# Patient Record
Sex: Female | Born: 1980 | Race: Asian | Hispanic: No | Marital: Married | Smoking: Never smoker
Health system: Southern US, Community
[De-identification: ages and names within clinical notes are randomized; demographics above are authoritative.]

## PROBLEM LIST (undated history)

## (undated) DIAGNOSIS — F319 Bipolar disorder, unspecified: Secondary | ICD-10-CM

## (undated) DIAGNOSIS — F32A Depression, unspecified: Secondary | ICD-10-CM

## (undated) DIAGNOSIS — F329 Major depressive disorder, single episode, unspecified: Secondary | ICD-10-CM

## (undated) DIAGNOSIS — R000 Tachycardia, unspecified: Secondary | ICD-10-CM

## (undated) DIAGNOSIS — F209 Schizophrenia, unspecified: Secondary | ICD-10-CM

## (undated) DIAGNOSIS — E282 Polycystic ovarian syndrome: Secondary | ICD-10-CM

## (undated) DIAGNOSIS — F419 Anxiety disorder, unspecified: Secondary | ICD-10-CM

## (undated) HISTORY — DX: Bipolar disorder, unspecified: F31.9

## (undated) HISTORY — DX: Schizophrenia, unspecified: F20.9

## (undated) HISTORY — DX: Polycystic ovarian syndrome: E28.2

---

## 2009-02-19 ENCOUNTER — Other Ambulatory Visit: Admission: RE | Admit: 2009-02-19 | Discharge: 2009-02-19 | Payer: Self-pay | Admitting: Obstetrics and Gynecology

## 2011-11-09 ENCOUNTER — Emergency Department (INDEPENDENT_AMBULATORY_CARE_PROVIDER_SITE_OTHER)
Admission: EM | Admit: 2011-11-09 | Discharge: 2011-11-09 | Disposition: A | Discharge status: Home / Self Care | Payer: 59 | Source: Home / Self Care | Attending: Family Medicine | Admitting: Family Medicine

## 2011-11-09 ENCOUNTER — Encounter: Payer: Self-pay | Admitting: Emergency Medicine

## 2011-11-09 DIAGNOSIS — F4320 Adjustment disorder, unspecified: Secondary | ICD-10-CM

## 2011-11-09 DIAGNOSIS — F5102 Adjustment insomnia: Secondary | ICD-10-CM

## 2011-11-09 HISTORY — DX: Tachycardia, unspecified: R00.0

## 2011-11-09 MED ORDER — ALPRAZOLAM 0.25 MG PO TABS: 0.2500 mg | ORAL_TABLET | Freq: Three times a day (TID) | ORAL | Status: DC | PRN

## 2011-11-09 MED ORDER — ZOLPIDEM TARTRATE 5 MG PO TABS: 5.0000 mg | ORAL_TABLET | Freq: Every evening | ORAL | Status: DC | PRN

## 2011-11-09 NOTE — ED Provider Notes (Signed)
History     CSN: [id number] Arrival date & time: 11/09/2011  1:28 PM   First MD Initiated Contact with Patient 11/09/11 1453      Chief Complaint  Patient presents with  . Insomnia    (Consider location/radiation/quality/duration/timing/severity/associated sxs/prior treatment) HPI Comments: Jessica Steele presents for evaluation of anxiety and insomnia secondary to stress at work; completed a recent project but is not appreciated, she feels, and argued with boss. She denies any hx of anxiety, panic attacks, or mental illness. She reports increased stress at work. She reports her symptoms as inappropriate crying, feelings of overwhelm, racing heart rate and chest tightness. Patient becomes tearful at the end of the visit, for no apparent reason, stating that she is worried about her symptoms.   Patient is a 30 y.o. female presenting with anxiety. The history is provided by the patient and the spouse.  Anxiety This is a new problem. The problem has not changed since onset.The symptoms are aggravated by nothing. The symptoms are relieved by nothing. She has tried nothing for the symptoms.    Past Medical History  Diagnosis Date  . Fast heart beat     associated with anxiety    History reviewed. No pertinent past surgical history.  History reviewed. No pertinent family history.  History  Substance Use Topics  . Smoking status: Never Smoker   . Smokeless tobacco: Not on file  . Alcohol Use: No    OB History    Grav Para Term Preterm Abortions TAB SAB Ect Mult Living                  Review of Systems  Constitutional: Negative.   HENT: Negative.   Eyes: Negative.   Respiratory: Negative.   Cardiovascular: Negative.   Gastrointestinal: Negative.   Genitourinary: Negative.   Musculoskeletal: Negative.   Skin: Negative.   Neurological: Negative.   Psychiatric/Behavioral: The patient is nervous/anxious.     Allergies  Review of patient's allergies indicates no known  allergies.  Home Medications   Current Outpatient Rx  Name Route Sig Dispense Refill  . ZOLPIDEM TARTRATE 5 MG PO TABS Oral Take 5 mg by mouth at bedtime as needed. sleep       BP 119/85  Pulse 116  Temp(Src) 98.2 F (36.8 C) (Oral)  Resp 18  SpO2 100%  LMP 11/02/2011  Physical Exam  Nursing note and vitals reviewed. Constitutional: She is oriented to person, place, and time. She appears well-developed and well-nourished.  HENT:  Head: Normocephalic and atraumatic.  Right Ear: Tympanic membrane normal.  Left Ear: Tympanic membrane normal.  Mouth/Throat: Uvula is midline, oropharynx is clear and moist and mucous membranes are normal.  Eyes: EOM are normal.  Neck: Normal range of motion.  Cardiovascular: Normal rate and regular rhythm.   Pulmonary/Chest: Effort normal and breath sounds normal. She has no wheezes. She has no rhonchi. She has no rales.  Musculoskeletal: Normal range of motion.  Neurological: She is alert and oriented to person, place, and time.  Skin: Skin is warm and dry.  Psychiatric: Her behavior is normal.    ED Course  Procedures (including critical care time)  Labs Reviewed - No data to display No results found.   1. Adjustment insomnia   2. Adjustment disorder       MDM          Richardo Priest, MD 11/27/11 [phone]

## 2011-11-09 NOTE — ED Notes (Signed)
"  tension' anxiety"more difficulty than usual sleeping.

## 2011-11-10 ENCOUNTER — Emergency Department (HOSPITAL_COMMUNITY)
Admission: EM | Admit: 2011-11-10 | Discharge: 2011-11-10 | Disposition: A | Discharge status: Other Acute Inpatient Hospital | Payer: 59 | Attending: Emergency Medicine | Admitting: Emergency Medicine

## 2011-11-10 ENCOUNTER — Emergency Department (HOSPITAL_COMMUNITY): Payer: 59

## 2011-11-10 ENCOUNTER — Encounter (HOSPITAL_COMMUNITY): Payer: Self-pay | Admitting: *Deleted

## 2011-11-10 DIAGNOSIS — F29 Unspecified psychosis not due to a substance or known physiological condition: Secondary | ICD-10-CM | POA: Insufficient documentation

## 2011-11-10 DIAGNOSIS — F411 Generalized anxiety disorder: Secondary | ICD-10-CM | POA: Insufficient documentation

## 2011-11-10 DIAGNOSIS — F419 Anxiety disorder, unspecified: Secondary | ICD-10-CM

## 2011-11-10 LAB — URINALYSIS, ROUTINE W REFLEX MICROSCOPIC
Glucose, UA: NEGATIVE mg/dL
Leukocytes, UA: NEGATIVE
Nitrite: NEGATIVE
Specific Gravity, Urine: 1.01 (ref 1.005–1.030)
pH: 7.5 (ref 5.0–8.0)

## 2011-11-10 LAB — BASIC METABOLIC PANEL
CO2: 24 mEq/L (ref 19–32)
Chloride: 102 mEq/L (ref 96–112)
GFR calc Af Amer: >90 mL/min (ref >90)
Potassium: 3.6 mEq/L (ref 3.5–5.1)
Sodium: 136 mEq/L (ref 135–145)

## 2011-11-10 LAB — DIFFERENTIAL
Eosinophils Relative: 1 % (ref 0–5)
Lymphocytes Relative: 25 % (ref 12–46)
Lymphs Abs: 4.2 10*3/uL — ABNORMAL HIGH (ref 0.7–4.0)
Neutro Abs: 11.4 10*3/uL — ABNORMAL HIGH (ref 1.7–7.7)
Neutrophils Relative %: 68 % (ref 43–77)

## 2011-11-10 LAB — RAPID URINE DRUG SCREEN, HOSP PERFORMED
Amphetamines: NOT DETECTED
Cocaine: NOT DETECTED
Opiates: NOT DETECTED
Tetrahydrocannabinol: NOT DETECTED

## 2011-11-10 LAB — CBC
MCV: 86.6 fL (ref 78.0–100.0)
Platelets: 392 10*3/uL (ref 150–400)
RBC: 4.91 MIL/uL (ref 3.87–5.11)
WBC: 16.7 10*3/uL — ABNORMAL HIGH (ref 4.0–10.5)

## 2011-11-10 MED ORDER — ALUM & MAG HYDROXIDE-SIMETH 200-200-20 MG/5ML PO SUSP: 30.0000 mL | ORAL | Status: DC | PRN

## 2011-11-10 MED ORDER — NICOTINE 21 MG/24HR TD PT24: 21.0000 mg | MEDICATED_PATCH | Freq: Every day | TRANSDERMAL | Status: DC

## 2011-11-10 MED ORDER — ONDANSETRON HCL 4 MG PO TABS: 4.0000 mg | ORAL_TABLET | Freq: Three times a day (TID) | ORAL | Status: DC | PRN

## 2011-11-10 MED ORDER — ZOLPIDEM TARTRATE 5 MG PO TABS: 5.0000 mg | ORAL_TABLET | Freq: Every evening | ORAL | Status: DC | PRN

## 2011-11-10 MED ORDER — ACETAMINOPHEN 325 MG PO TABS: 650.0000 mg | ORAL_TABLET | ORAL | Status: DC | PRN

## 2011-11-10 MED ORDER — QUETIAPINE FUMARATE 50 MG PO TABS
50.0000 mg | ORAL_TABLET | Freq: Every day | ORAL | Status: DC
  Administered 2011-11-10: 50 mg via ORAL
  Filled 2011-11-10 (×2): qty 1

## 2011-11-10 MED ORDER — LORAZEPAM 1 MG PO TABS: 1.0000 mg | ORAL_TABLET | Freq: Three times a day (TID) | ORAL | Status: DC | PRN

## 2011-11-10 MED ORDER — IBUPROFEN 600 MG PO TABS: 600.0000 mg | ORAL_TABLET | Freq: Three times a day (TID) | ORAL | Status: DC | PRN

## 2011-11-10 MED ORDER — ZIPRASIDONE MESYLATE 20 MG IM SOLR
20.0000 mg | Freq: Once | INTRAMUSCULAR | Status: AC
  Administered 2011-11-10: 20 mg via INTRAMUSCULAR
  Filled 2011-11-10 (×2): qty 20

## 2011-11-10 NOTE — ED Notes (Signed)
PA-C states "pt verbalized wanting to hurt herself"; acuity changed.

## 2011-11-10 NOTE — ED Notes (Signed)
Pt became agitated when husband was present.  Husband was asked to leave until patient calmed down.  Pt then removed all of her clothes and got into the cabinet in her room.  Advised her to dress and get onto the stretcher.  Pt refused to listen.  Dr Rogers Blocker advised and he ordered geodon.  Pt refused to take med.  Had to be held to administer.  Pt was very combative and attempted to assault staff while getting the injection.

## 2011-11-10 NOTE — ED Notes (Signed)
Spouse states "I didn't want to give her the xanax she got yesterday, I just wanted her to sleep"

## 2011-11-10 NOTE — ED Provider Notes (Signed)
5:05 PM patient is alert ambulatory mildly agitated and argumentative with husband. Plan transfer to [facility]. Accepting physician Dr. Gaspar Skeeters, MD 11/10/11 [phone]

## 2011-11-10 NOTE — ED Notes (Addendum)
Currently no 400 hall beds at [facility]. TC to [facility] & spoke with Jill Alexanders who stated they do have some openings. Faxed info over for their review. TC from [place] stating that pt has been accepted by Dr. Les Pou to [facility].

## 2011-11-10 NOTE — ED Notes (Signed)
Pt states "I have been having a lot of anxiety, am pressured @ work, am not sleeping"

## 2011-11-10 NOTE — ED Notes (Signed)
Pt placed in paper scrubs, wanded by Security, possessions taken to car by family member

## 2011-11-10 NOTE — ED Notes (Signed)
Patient family/friend took pt belongings home.

## 2011-11-10 NOTE — BH Assessment (Signed)
Assessment Note   Jessica Steele is an 30 y.o. female. Brought in by husband. Pt and husband reported pt not sleeping for the past 3 days, flight of ideas, tangential thought/speech and manic behaviors. During assessment, pt states that "I am in control of my emotions and know what I am doing" numerous times and stated she had been reviewing "all of the traumatic things that have occurred to me in my life since I was born". Pt did report having some stressors at work, feeling as if she had caused some pressure on a colleague which she could not forget about. Husband reported to psych staff that pt has no prior MH history. Prior to assessment, pt had removed all of her clothing and was naked in the cubby of the room. Upon entering, pt got dressed and spoke with ACT.  Axis I: Psychotic Disorder NOS Axis II: Deferred Axis III:  Past Medical History  Diagnosis Date  . Fast heart beat     associated with anxiety   Axis IV: occupational problems Axis V: 11-20 some danger of hurting self or others possible OR occasionally fails to maintain minimal personal hygiene OR gross impairment in communication  Past Medical History:  Past Medical History  Diagnosis Date  . Fast heart beat     associated with anxiety    History reviewed. No pertinent past surgical history.  Family History: No family history on file.  Social History:  reports that she has never smoked. She does not have any smokeless tobacco history on file. She reports that she does not drink alcohol or use illicit drugs.  Allergies: No Known Allergies  Home Medications:  Medications Prior to Admission  Medication Dose Route Frequency Provider Last Rate Last Dose  . acetaminophen (TYLENOL) tablet 650 mg  650 mg Oral Q4H PRN Jethro Bastos, NP      . LORazepam (ATIVAN) tablet 1 mg  1 mg Oral Q8H PRN Jethro Bastos, NP      . ondansetron [facility]) tablet 4 mg  4 mg Oral Q8H PRN Jethro Bastos, NP      . QUEtiapine (SEROQUEL) tablet  50 mg  50 mg Oral QHS Shamsher Alvie Heidelberg, MD      . ziprasidone (GEODON) injection 20 mg  20 mg Intramuscular Once Katheren Puller, MD   20 mg at 11/10/11 1144   Medications Prior to Admission  Medication Sig Dispense Refill  . zolpidem (AMBIEN) 5 MG tablet Take 5 mg by mouth at bedtime as needed. sleep         OB/GYN Status:  Patient's last menstrual period was 11/02/2011.  General Assessment Data Assessment Number: 1  Living Arrangements: Spouse/significant other Can pt return to current living arrangement?: Yes Admission Status: Involuntary Is patient capable of signing voluntary admission?: No Transfer from: Home Referral Source: Self/Family/Friend  Risk to self Suicidal Ideation: No Suicidal Intent: No Is patient at risk for suicide?: No Suicidal Plan?: No Access to Means: No What has been your use of drugs/alcohol within the last 12 months?: Use of Xanax past few days, accoring to spouse Other Self Harm Risks: n/a Triggers for Past Attempts: None known Intentional Self Injurious Behavior: None Factors that decrease suicide risk: Sense of responsibility to family Family Suicide History: Unable to assess Recent stressful life event(s): Other (Comment) (job stress - "Putting a little pressure on someone") Persecutory voices/beliefs?: No Depression: Yes Depression Symptoms: Insomnia;Tearfulness;Feeling worthless/self pity Substance abuse history and/or treatment for substance abuse?: No Suicide  prevention information given to non-admitted patients: Not applicable  Risk to Others Homicidal Ideation: No Thoughts of Harm to Others: No Current Homicidal Intent: No Current Homicidal Plan: No Access to Homicidal Means: No Identified Victim: n/a History of harm to others?: No Assessment of Violence: None Noted Violent Behavior Description: n/a Does patient have access to weapons?: No Criminal Charges Pending?: No Does patient have a court date: No  Mental Status  Report Appear/Hygiene: Disheveled Eye Contact: Fair Motor Activity: Restlessness Speech: Tangential Level of Consciousness: Restless;Combative (combative at times, refusing medication) Mood: Labile Affect: Labile Anxiety Level: Minimal Thought Processes: Tangential Judgement: Impaired Orientation: Person;Place;Time Obsessive Compulsive Thoughts/Behaviors: None  Cognitive Functioning Concentration: Decreased Memory: Recent Impaired;Remote Impaired IQ: Average Insight: Poor Impulse Control: Poor Appetite: Fair Sleep: Decreased (Not slept in 3 days) Total Hours of Sleep: 0  Vegetative Symptoms: None  Prior Inpatient/Outpatient Therapy Prior Therapy:  (No Prior MH IPT or OPT hx)  ADL Screening (condition at time of admission) Patient's cognitive ability adequate to safely complete daily activities?: Yes Patient able to express need for assistance with ADLs?: Yes Independently performs ADLs?: Yes Weakness of Legs: None Weakness of Arms/Hands: None  Home Assistive Devices/Equipment Home Assistive Devices/Equipment: None    Abuse/Neglect Assessment (Assessment to be complete while patient is alone) Physical Abuse: Denies Verbal Abuse: Denies Sexual Abuse: Denies Exploitation of patient/patient's resources: Denies Self-Neglect: Denies     Merchant navy officer (For Healthcare) Advance Directive: Patient does not have advance directive;Patient would not like information Pre-existing out of facility DNR order (yellow form or pink MOST form): No    Additional Information 1:1 In Past 12 Months?: No CIRT Risk: Yes Elopement Risk: No Does patient have medical clearance?: Yes     Disposition:  Disposition Disposition of Patient: Referred to [facility] for Review)  On Site Evaluation by:   Reviewed with Physician:     Romeo Apple 11/10/2011 12:11 PM

## 2011-11-10 NOTE — ED Provider Notes (Signed)
History     CSN: [id number] Arrival date & time: 11/10/2011  7:54 AM   First MD Initiated Contact with Patient 11/10/11 [phone]      Chief Complaint  Patient presents with  . Anxiety    (Consider location/radiation/quality/duration/timing/severity/associated sxs/prior treatment) Patient is a 30 y.o. female presenting with anxiety. The history is provided by the patient. No language interpreter was used.  Anxiety Pertinent negatives include no abdominal pain, anorexia, chills, congestion, coughing, diaphoresis, fatigue, fever, headaches, joint swelling, neck pain, numbness, rash, sore throat, swollen glands, vertigo, visual change or weakness. The treatment provided no relief.   Increasing irritability and depression especially for the last 6 months.  Flight of ideas as we speak.  States that she has been thinking about hurting herself but has no plan.  Husband states that she has been depressed but only for days at a time. This time it has lasted 3 days and she is not sleeping.  Patient states that she tries to please everyone to be their friend.  Then goes on to states that the water comes up over her head and then she is not their friend anymore. Past Medical History  Diagnosis Date  . Fast heart beat     associated with anxiety    History reviewed. No pertinent past surgical history.  No family history on file.  History  Substance Use Topics  . Smoking status: Never Smoker   . Smokeless tobacco: Not on file  . Alcohol Use: No    OB History    Grav Para Term Preterm Abortions TAB SAB Ect Mult Living                  Review of Systems  Constitutional: Negative for fever, chills, diaphoresis and fatigue.  HENT: Negative for congestion, sore throat and neck pain.   Respiratory: Negative for cough.   Gastrointestinal: Negative for abdominal pain and anorexia.  Musculoskeletal: Negative for joint swelling.  Skin: Negative for rash.  Neurological: Negative for vertigo,  weakness, numbness and headaches.  All other systems reviewed and are negative.    Allergies  Review of patient's allergies indicates no known allergies.  Home Medications   Current Outpatient Rx  Name Route Sig Dispense Refill  . ZOLPIDEM TARTRATE 5 MG PO TABS Oral Take 5 mg by mouth at bedtime as needed. sleep       BP 135/89  Pulse 101  Temp(Src) 98.8 F (37.1 C) (Oral)  Resp 17  Wt 128 lb (58.06 kg)  SpO2 99%  LMP 11/02/2011  Physical Exam  Nursing note and vitals reviewed. Constitutional: She is oriented to person, place, and time. She appears well-developed and well-nourished. She appears distressed.  HENT:  Head: Normocephalic.  Cardiovascular: Normal heart sounds and normal pulses.  Tachycardia present.  Exam reveals no gallop.   No murmur heard. Pulmonary/Chest: Effort normal and breath sounds normal. No respiratory distress.  Abdominal: Soft.  Musculoskeletal: Normal range of motion.  Neurological: She is alert and oriented to person, place, and time.  Skin: Skin is warm.  Psychiatric:       Abnormal thought content.  Pressured speech.  ? Manic.  No sleep in 3 days.    ED Course  Procedures (including critical care time)  Labs Reviewed - No data to display No results found.   No diagnosis found.    MDM  Here for increasing depression and suicidal ideations.  With her husband who states she has not slept in 3 days.  Pressured speech and manic behavior in the ER.  Suspect bipolar disorder.  For Behavior health to evaluate.          Jethro Bastos, NP 11/10/11 1626

## 2011-11-10 NOTE — Consult Note (Signed)
Patient Identification:  Jessica Steele Date of Evaluation:  11/10/2011   History of Present Illness:    30 year old female from [place] who is currently very disorganized patient's husband told me that she has not slept for the last 3 days and whenever she becomes angry she goes through this kind of episodes. But patient at this time is unable to make a logical conversation she is laughing hysterically during interview. Her husband told me that the she talks about things happened in the past before to marriage which don't make any sense to him.  He was also very the upset and was crying.  He denied any past psych history or drug abuse history.  Mental status examination-patient is ready disorganized laughing hysterically responding to inner stimuli alert awake oriented to person but not to place and time attention and concentration poor abstract poor insight and judgment poor  Past Medical History:     Past Medical History  Diagnosis Date  . Fast heart beat     associated with anxiety      History reviewed. No pertinent past surgical history.  Allergies: No Known Allergies  Current Medications:  Prior to Admission medications   Medication Sig Start Date End Date Taking? Authorizing Provider  zolpidem (AMBIEN) 5 MG tablet Take 5 mg by mouth at bedtime as needed. sleep  11/09/11 11/08/12  Richardo Priest, MD    Social History:    reports that she has never smoked. She does not have any smokeless tobacco history on file. She reports that she does not drink alcohol or use illicit drugs.   Family History:    No family history on file.   DIAGNOSIS:   AXIS I  psychosis NOS   AXIS II  Deffered  AXIS III See medical notes.  AXIS IV  nonspecified   AXIS V 31-40 impairment in reality testing     Recommendations: #1 Geodon 20 mg given stat #2 I started the patient on Seroquel 50 mg 1 dose now and 50 mg at bedtime #3 I will also get a CAT scan done as it is the first psychotic  break.    Eulogio Ditch, MD

## 2011-11-11 NOTE — ED Provider Notes (Signed)
Medical screening examination/treatment/procedure(s) were conducted as a shared visit with non-physician practitioner(s) and myself.  I personally evaluated the patient during the encounter.  Complains of anxiety, depression, lack of sleep. Behavioral health consult  Donnetta Hutching, MD 11/11/11 [phone]

## 2012-04-06 IMAGING — CT CT HEAD W/O CM
2 series · 17 of 30 positions shown, 20 images · non-contrast
Comparison: None

CLINICAL DATA: First psychotic break, insomnia, flight of ideas,
confusion, altered and manic behavior

CT HEAD WITHOUT CONTRAST
TECHNIQUE: Contiguous axial images were obtained from the base of
the skull through the vertex without contrast.

[Series 2: head w/o · axial · non-contrast · 0.43mm/px · z∈[-135,-15]mm · 9 of 30 slices shown, 12 images]
[im 3/30  brain]
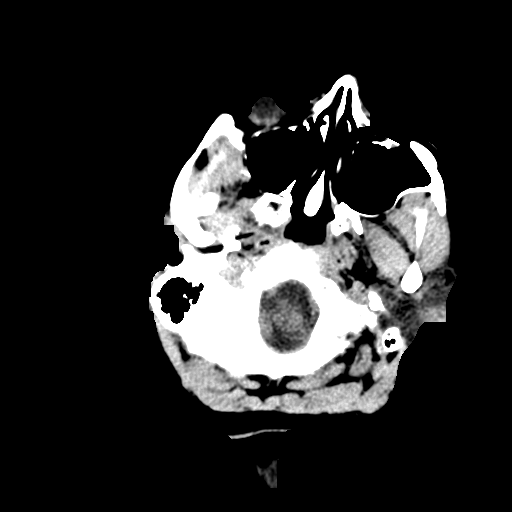
[im 3/30  bone]
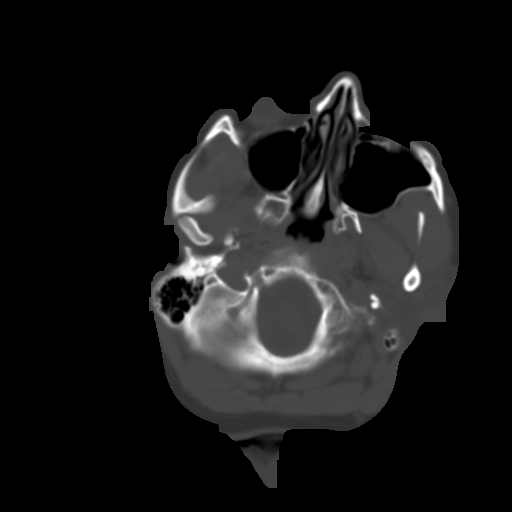
[im 6/30  brain]
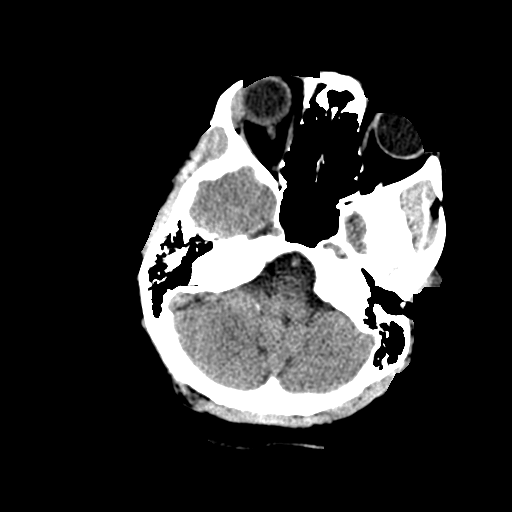
[im 9/30  brain]
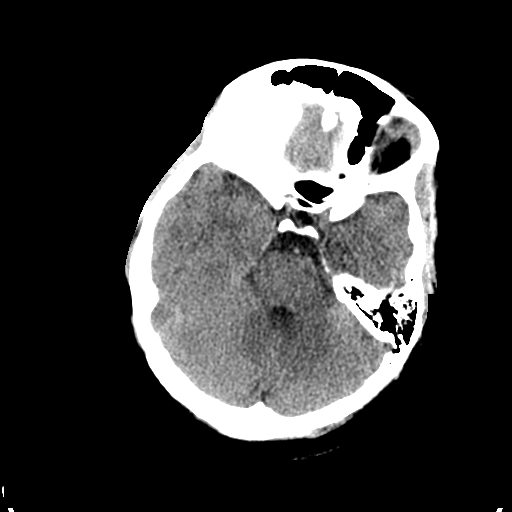
[im 12/30  brain]
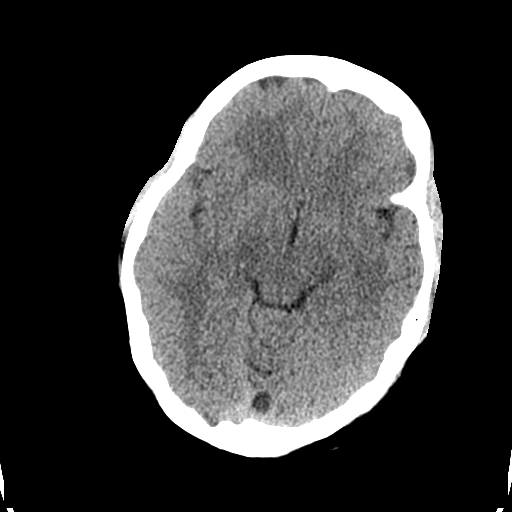
[im 15/30  brain]
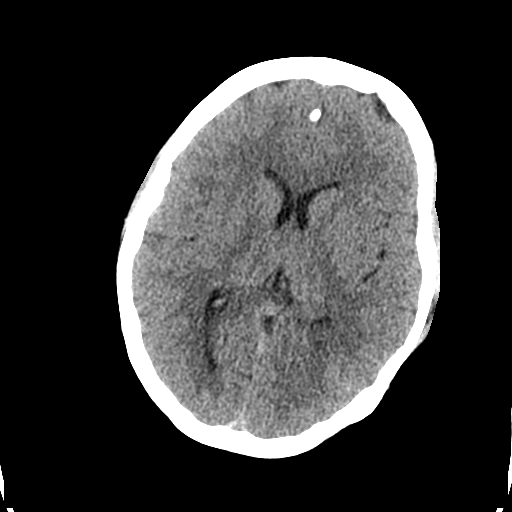
[im 15/30  bone]
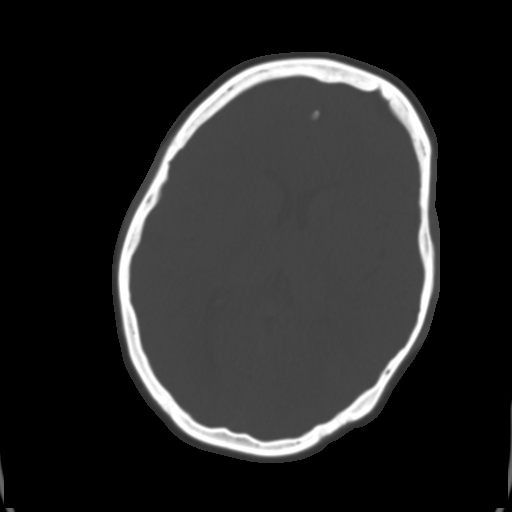
[im 18/30  brain]
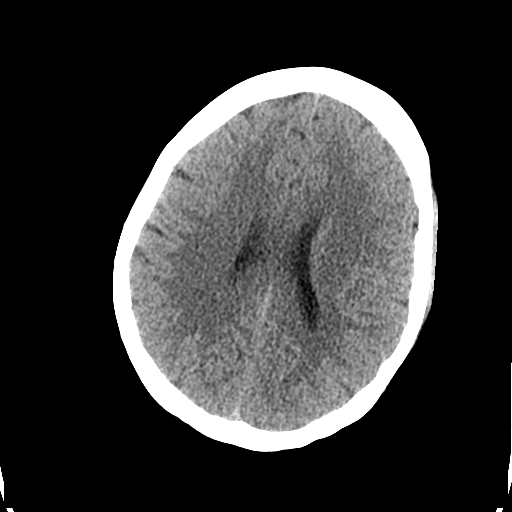
[im 21/30  brain]
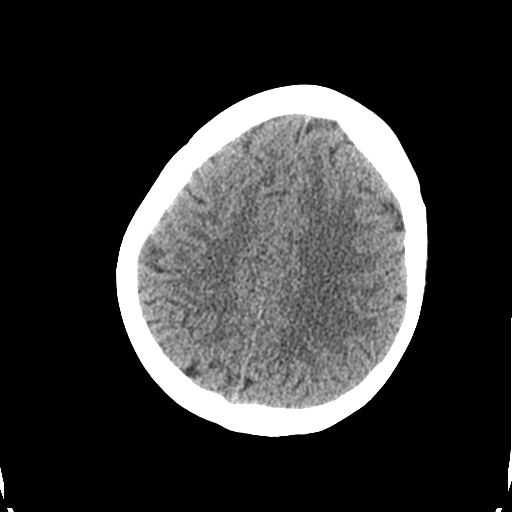
[im 24/30  brain]
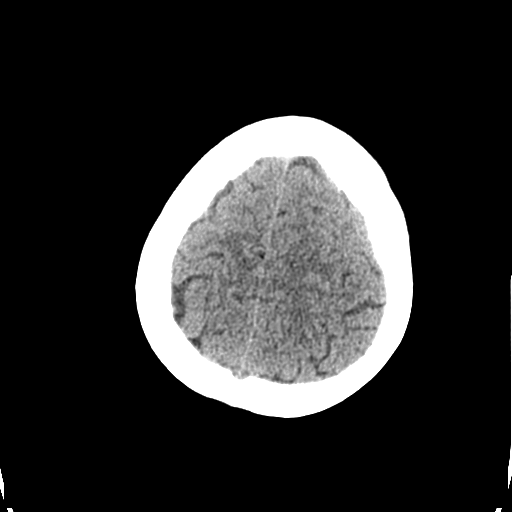
[im 27/30  brain]
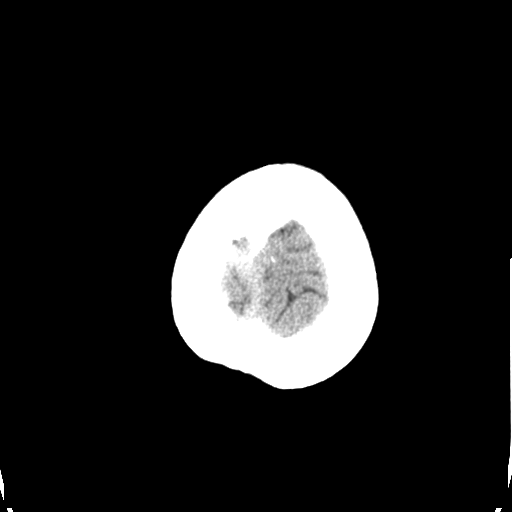
[im 27/30  bone]
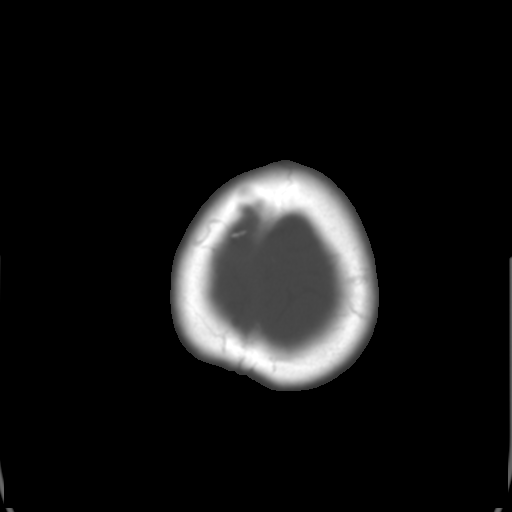

[Series 3: bone windows · axial · 0.43mm/px · z∈[-130,-19]mm · 8 of 49 slices shown]
[im 6/49  bone]
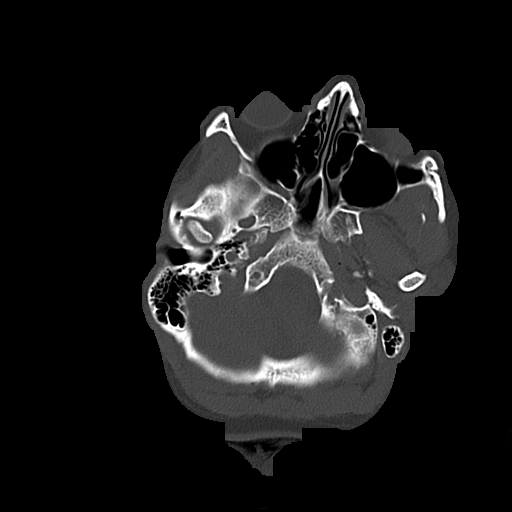
[im 11/49  bone]
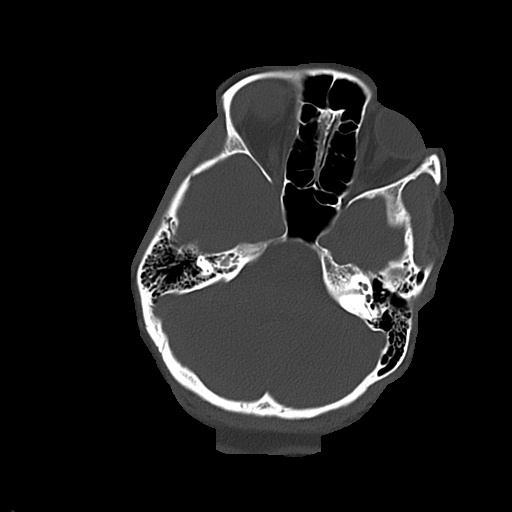
[im 17/49  bone]
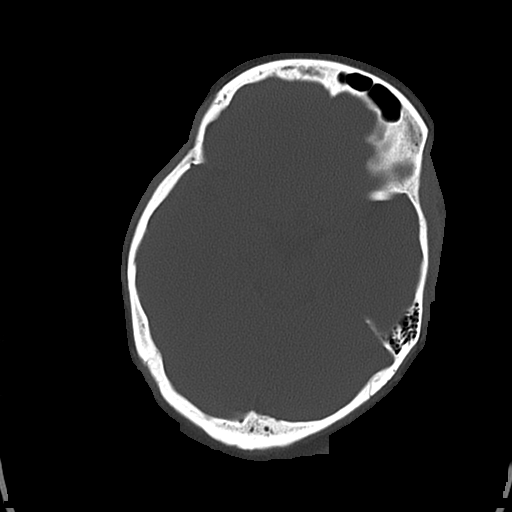
[im 22/49  bone]
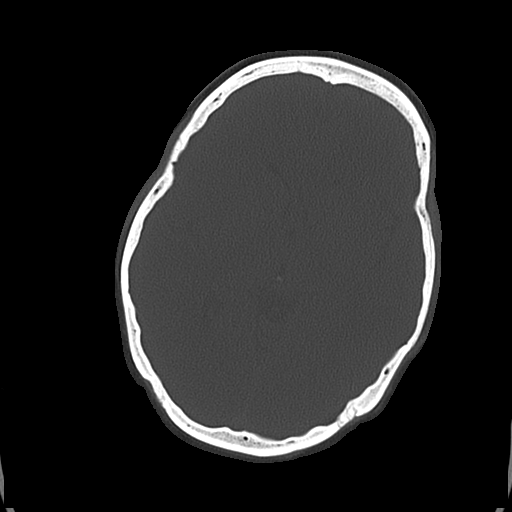
[im 27/49  bone]
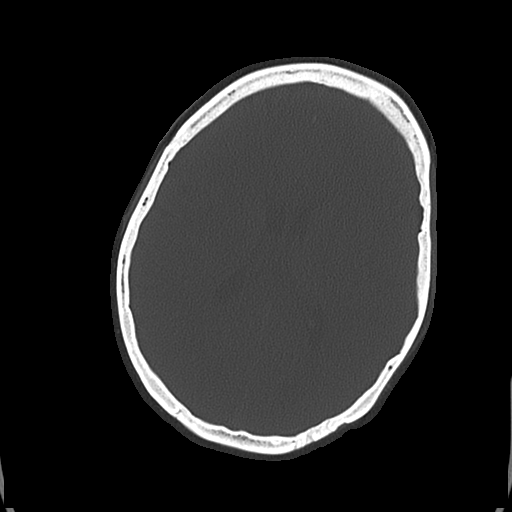
[im 33/49  bone]
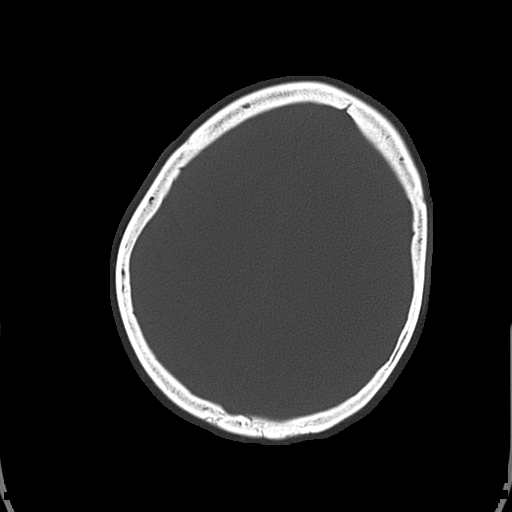
[im 38/49  bone]
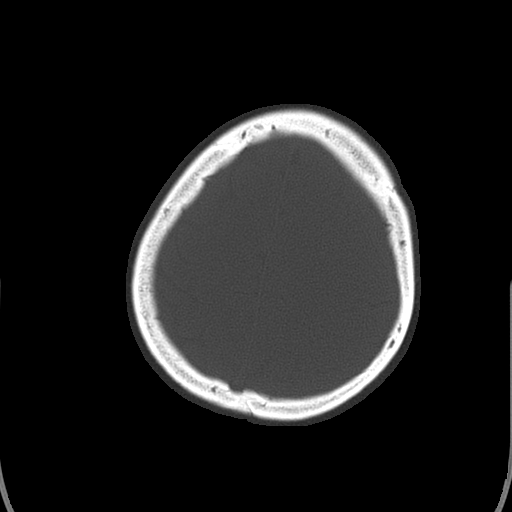
[im 43/49  bone]
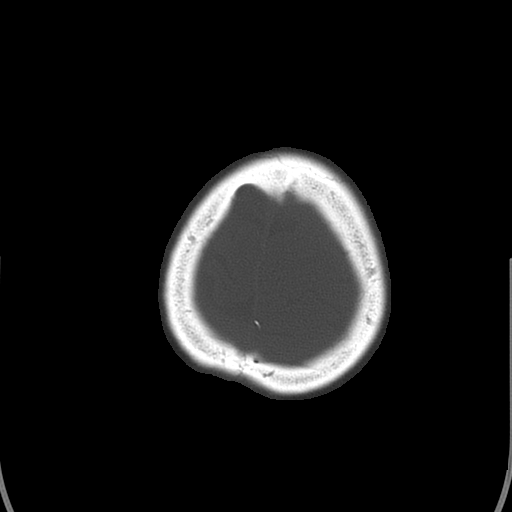

[17 of 30 positions shown; findings below may reference images not displayed]

FINDINGS: Normal ventricular morphology.
No midline shift or mass effect.
Normal appearance of brain parenchyma.
No intracranial hemorrhage, mass lesion, or acute infarction.
Visualized paranasal sinuses and mastoid air cells clear.
Bones unremarkable.
IMPRESSION: Normal exam.

## 2015-09-02 ENCOUNTER — Encounter (INDEPENDENT_AMBULATORY_CARE_PROVIDER_SITE_OTHER): Payer: Self-pay

## 2015-09-02 ENCOUNTER — Telehealth (HOSPITAL_COMMUNITY): Payer: Self-pay

## 2015-09-02 ENCOUNTER — Encounter (HOSPITAL_COMMUNITY): Payer: Self-pay | Admitting: Psychiatry

## 2015-09-02 ENCOUNTER — Ambulatory Visit (INDEPENDENT_AMBULATORY_CARE_PROVIDER_SITE_OTHER): Payer: 59 | Admitting: Psychiatry

## 2015-09-02 VITALS — BP 130/72 | HR 96 | Ht 60.5 in | Wt 169.2 lb

## 2015-09-02 DIAGNOSIS — Z79899 Other long term (current) drug therapy: Secondary | ICD-10-CM | POA: Diagnosis not present

## 2015-09-02 DIAGNOSIS — F23 Brief psychotic disorder: Secondary | ICD-10-CM | POA: Diagnosis not present

## 2015-09-02 MED ORDER — RISPERIDONE 2 MG PO TABS
2.0000 mg | ORAL_TABLET | Freq: Every day | ORAL | Status: DC
Start: 2015-09-02 — End: 2015-11-04

## 2015-09-02 NOTE — Progress Notes (Addendum)
Psychiatric Initial Adult Assessment   Patient Identification: Jessica Steele MRN:  [id number] Date of Evaluation:  09/02/2015 Referral Source: husband Chief Complaint:  establish care Visit Diagnosis:    ICD-9-CM ICD-10-CM   1. 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 Encounter for long-term (current) use of medications V58.69 Z79.899 EKG   Diagnosis:   Patient Active Problem List   Diagnosis Date Noted  . Brief psychotic disorder [F23] 09/02/2015   History of Present Illness:  Pt here to establish care today. States she is running out of meds as she recently moved here from [place]. In 2012 pt had a episode of possible mania vs psychosis that lead to an inpt admission for 10 days to [facility]. Since then pt denies manic and hypomanic symptoms including periods of decreased need for sleep, increased energy, mood lability, impulsivity, FOI, and excessive spending. Denies any further episodes of hallucinations and paranoia.   After the episode for about 1-1.5 yrs she was depressed and had crying spells. Pt was withdrawn, had low motivation, anhedonia, and isolation. She went to counselling and it helped. She has been on Effexor since early 2013 and is unsure if it is helping. Today denies depression, low motivation, anhedonia, crying spells, isolation, SI and HI. Pt is looking for work next month.   Sleeping about 11-12 hrs/night. Appetite, energy and concentration are good.   Pt is taking Effexor and Risperdal daily as prescribed and denies SE. Notes at doses of 3+mg pt has increase in Prolactin and produces breast milk and lack of periods. Reports her last period was in Jan 2016 when Risperdal dose was decreased from  to . Pt has a long hx of irregular periods due to PCOS.  Pt went to [facility] in [place] in April 2016 and reports all labs were normal.   Husband joined at the end of appt with pt's permission- states end of 2012 pt was under a lot of stress  from family and work. Within one week pt was not sleeping at all for 3 days and began acting strange. They went to the ED and was given Ambien. Pt took one dose and and she went to the ED again and was IVC to [facility]. She was confused, having racing thoughts, AVH and restlessness. After d/c pt was better but depressed. At follow up her Depakote and Risperdal was decreased and pt experienced some AH. Dose of Risperdal was increased and symptoms resolved. They have been back and forth to [place] since 2014 for [employer] reasons. They have now moved back to the [place]. For the last 6 months pt has been doing well. He is a little concerned that she has mood swings that are stress induced. Pt will get angry for about 4 hrs and then is back to normal.    Elements:  Severity:  mild. Timing:  ongoing. Duration:  4  yrs. Context:  quality of life. Associated Signs/Symptoms: Depression Symptoms:  denies. see above. (Hypo) Manic Symptoms:  denies.see above Anxiety Symptoms:  denies Psychotic Symptoms:  denies PTSD Symptoms: Negative  Past Medical History:  Past Medical History  Diagnosis Date  . Fast heart beat     associated with anxiety  . Bipolar disorder   . Schizophrenia     Past Surgical History  Procedure Laterality Date  . Cesarean section     Past Psych Hx: Dx: Bipolar disorder vs Schizophrenia began in 2012 due to stress and sleep issues. Symptoms included AH, hypersexual,  talkative, confusion. Pt doesn't have much memory of the episode and states it lasted for 6 days. Prior had no past psych hx. Meds: Klonopin, Risperdal 3+mg cause rise in Prolactin and lactation, Depakote Previous psychiatrist/therapist: since admission was treated by Dr. Zollie Beckers and a psychiatrist in [place] in 2105.  Hospitalizations: Pt was in [facility] for 10 days in Dec 2012. SIB: denies Suicide attempts: denies Hx of violent behavior towards others: denies. Denies current access to guns Hx of abuse:  denies   Family History:  Family History  Problem Relation Age of Onset  . Alcohol abuse Neg Hx   . Anxiety disorder Neg Hx   . Bipolar disorder Neg Hx   . Drug abuse Neg Hx   . Depression Neg Hx   . Schizophrenia Neg Hx   . Suicidality Neg Hx    Social History:   Social History   Social History  . Marital Status: Married    Spouse Name: N/A  . Number of Children: N/A  . Years of Education: N/A   Social History Main Topics  . Smoking status: Never Smoker   . Smokeless tobacco: None  . Alcohol Use: No  . Drug Use: No  . Sexual Activity: Not Asked   Other Topics Concern  . None   Social History Narrative   Additional Social History: Married and living with husband and 5yo son.  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 Grew up in [place]. Pt has a BS in engineering and denies problems in school.   Musculoskeletal: Strength & Muscle Tone: within normal limits Gait & Station: normal Patient leans: N/A  Psychiatric Specialty Exam: HPI  Review of Systems  Constitutional: Negative for fever, chills and weight loss.  HENT: Negative for congestion, ear pain, nosebleeds and sore throat.   Eyes: Negative for blurred vision, double vision and redness.  Respiratory: Negative for cough, shortness of breath and wheezing.   Cardiovascular: Negative for chest pain, palpitations and leg swelling.  Gastrointestinal: Negative for heartburn, nausea, vomiting, abdominal pain and diarrhea.  Musculoskeletal: Negative for back pain, joint pain and neck pain.  Skin: Negative for itching and rash.  Neurological: Negative for dizziness, tingling, tremors, seizures, loss of consciousness and headaches.    Blood pressure 130/72, pulse 96, height 5' 0.5" (1.537 m), weight 169 lb 3.2 oz (76.749 kg).Body mass index is 32.49 kg/(m^2).  General Appearance: Fairly Groomed  Eye Contact:  Good  Speech:  Clear and Coherent and Normal Rate  Volume:  Normal  Mood:  Euthymic   Affect:  Full Range  Thought Process:  Goal Directed, Linear and Logical  Orientation:  Full (Time, Place, and Person)  Thought Content:  Negative  Suicidal Thoughts:  No  Homicidal Thoughts:  No  Memory:  Immediate;   Good Recent;   Good Remote;   Good  Judgement:  Good  Insight:  Good  Psychomotor Activity:  Normal  Concentration:  Good  Recall:  Good  Fund of Knowledge:Good  Language: Good  Akathisia:  No  Handed:  Right  AIMS (if indicated):  AIMS:  Facial and Oral Movements  Muscles of Facial Expression: None, normal  Lips and Perioral Area: None, normal  Jaw: None, normal  Tongue: None, normal Extremity Movements: Upper (arms, wrists, hands, fingers): None, normal  Lower (legs, knees, ankles, toes): None, normal,  Trunk Movements:  Neck, shoulders, hips: None, normal,  Overall Severity : Severity of abnormal movements (highest score from questions  above): None, normal  Incapacitation due to abnormal movements: None, normal  Patient's awareness of abnormal movements (rate only patient's report): No Awareness, Dental Status  Current problems with teeth and/or dentures?: No  Does patient usually wear dentures?: No     Assets:  Communication Skills Desire for Improvement Financial Resources/Insurance Housing Intimacy Leisure Time Physical Health Resilience Social Support Talents/Skills Transportation Vocational/Educational  ADL's:  Intact  Cognition: WNL  Sleep:  good   Is the patient at risk to self?  No. Has the patient been a risk to self in the past 6 months?  No. Has the patient been a risk to self within the distant past?  No. Is the patient a risk to others?  No. Has the patient been a risk to others in the past 6 months?  No. Has the patient been a risk to others within the distant past?  No.  Allergies:  No Known Allergies Current Medications: Current Outpatient Prescriptions  Medication Sig Dispense Refill  . risperiDONE (RISPERDAL) 2 MG  tablet Take 2 mg by mouth at bedtime.    Marland Kitchen venlafaxine XR (EFFEXOR-XR) 75 MG 24 hr capsule Take 75 mg by mouth daily with breakfast.     No current facility-administered medications for this visit.    Previous Psychotropic Medications: yes see above   Substance Abuse History in the last 12 months:  No.  Consequences of Substance Abuse: Negative  Medical Decision Making:  Established Problem, Stable/Improving (1), Review of Psycho-Social Stressors (1), Review or order clinical lab tests (1), Review and summation of old records (2), Review of Medication Regimen & Side Effects (2) and Review of New Medication or Change in Dosage (2)  Treatment Plan Summary: Medication management and Plan see below  Assessment: Bipolar vs Schizophrenia  Meds: Risperdal  po qHS for mood stabalization D/c Effexor as pt reports depression symptoms have been in remission since 2014. Pt will monitor for signs of depression. Discussed withdrawal symtpoms. Will restart if mood declines.  Medication management with supportive therapy. Risks/benefits and SE of the medication discussed. Pt verbalized understanding and verbal consent obtained for treatment.  Affirm with the patient that the medications are taken as ordered. Patient expressed understanding of how their medications were to be used.   Reviewed records from [facility] and previous psychiatrist- consistent with pt's reported hx.   Labs- order EKG Pt will bring in recent labs for review. Pt reports symptoms of amenorrhea, hair loss and weight gain that could be SE from Risperdal or due to PCOS. Unclear at this time so will continue to monitor.   Therapy: brief supportive therapy provided. Discussed psychosocial stressors in detail.    Pt denies SI and is at an acute low risk for suicide.Patient told to call clinic if any problems occur. Patient advised to go to ER if they should develop SI/HI, side effects, or if symptoms worsen. Has crisis numbers  to call if needed. Pt verbalized understanding.  F/up in 2 months or sooner if needed   AGARWAL, SALINA 9/27/201611:00 AM

## 2015-09-02 NOTE — Patient Instructions (Signed)
1.Call [phone] to schedule the EKG 2. Drop off copy of recent labs to Dr. Michae Kava

## 2015-09-12 ENCOUNTER — Ambulatory Visit (HOSPITAL_COMMUNITY)
Admission: RE | Admit: 2015-09-12 | Discharge: 2015-09-12 | Disposition: A | Discharge status: Home / Self Care | Payer: 59 | Source: Ambulatory Visit | Attending: Psychiatry | Admitting: Psychiatry

## 2015-09-12 ENCOUNTER — Other Ambulatory Visit (HOSPITAL_COMMUNITY): Payer: Self-pay

## 2015-09-12 DIAGNOSIS — R000 Tachycardia, unspecified: Secondary | ICD-10-CM | POA: Insufficient documentation

## 2015-09-12 DIAGNOSIS — Z79899 Other long term (current) drug therapy: Secondary | ICD-10-CM | POA: Diagnosis not present

## 2015-09-12 DIAGNOSIS — Z5181 Encounter for therapeutic drug level monitoring: Secondary | ICD-10-CM | POA: Diagnosis not present

## 2015-11-04 ENCOUNTER — Encounter (HOSPITAL_COMMUNITY): Payer: Self-pay | Admitting: Psychiatry

## 2015-11-04 ENCOUNTER — Ambulatory Visit (INDEPENDENT_AMBULATORY_CARE_PROVIDER_SITE_OTHER): Payer: 59 | Admitting: Psychiatry

## 2015-11-04 VITALS — BP 130/70 | HR 76 | Resp 12 | Wt 177.4 lb

## 2015-11-04 DIAGNOSIS — F23 Brief psychotic disorder: Secondary | ICD-10-CM

## 2015-11-04 MED ORDER — RISPERIDONE 2 MG PO TABS: 2.0000 mg | ORAL_TABLET | Freq: Every day | ORAL | Status: DC

## 2015-11-04 NOTE — Progress Notes (Signed)
BH MD/PA/NP OP Progress Note  11/04/2015 2:33 PM Jessica Steele  MRN:  [id number]  Subjective:  Today here with husband.  Pt states she is doing well and denies any concerns at this time.  Pt denies depression. Denies anhedonia, isolation, crying spells, low motivation, poor hygiene, worthlessness and hopelessness. Denies SI/HI.  Denies AVH, ideas of reference and paranoia.   Sleeping 10 hrs/night. Appetite, energy and concentration are good.  Denies manic and hypomanic symptoms including periods of decreased need for sleep, increased energy, mood lability, impulsivity, FOI, and excessive spending.  Husband has no concerns and states pt is doing well.  Taking meds as prescribed and denies SE.    Chief Complaint: doing ok Chief Complaint    Follow-up     Visit Diagnosis:     ICD-9-CM ICD-10-CM   1. 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 Fast heart beat     associated with anxiety  . Bipolar disorder (HCC)   . Schizophrenia (HCC)   . PCOS (polycystic ovarian syndrome)     Past Surgical History  Procedure Laterality Date  . Cesarean section     Past Psych Hx: Dx: Bipolar disorder vs Schizophrenia began in 2012 due to stress and sleep issues. Symptoms included AH, hypersexual, talkative, confusion. Pt doesn't have much memory of the episode and states it lasted for 6 days. Prior had no past psych hx. Meds: Klonopin, Risperdal 3+mg cause rise in Prolactin and lactation, Depakote Previous psychiatrist/therapist: since admission was treated by Dr. Zollie Beckers and a psychiatrist in [place] in 2105.  Hospitalizations: Pt was in [facility] for 10 days in Dec 2012. SIB: denies Suicide attempts: denies Hx of violent behavior towards others: denies. Denies current access to guns Hx of abuse: denies  Family History:  Family History  Problem Relation Age of Onset  . Alcohol abuse  Neg Hx   . Anxiety disorder Neg Hx   . Bipolar disorder Neg Hx   . Drug abuse Neg Hx   . Depression Neg Hx   . Schizophrenia Neg Hx   . Suicidality Neg Hx    Social History:  Social History   Social History  . Marital Status: Married    Spouse Name: N/A  . Number of Children: N/A  . Years of Education: N/A   Social History Main Topics  . Smoking status: Never Smoker   . Smokeless tobacco: None  . Alcohol Use: No  . Drug Use: No  . Sexual Activity: Not Asked   Other Topics Concern  . None   Social History Narrative   Additional History: n/a   Musculoskeletal: Strength & Muscle Tone: within normal limits Gait & Station: normal Patient leans: N/A  Psychiatric Specialty Exam: HPI  Review of Systems  Constitutional: Negative for fever, chills and weight loss.  HENT: Negative for congestion and sore throat.   Eyes: Negative for blurred vision, double vision and redness.  Respiratory: Negative for cough, shortness of breath and wheezing.   Cardiovascular: Negative for chest pain, palpitations and leg swelling.  Gastrointestinal: Negative for heartburn, nausea, vomiting and abdominal pain.  Musculoskeletal: Negative for back pain, joint pain and neck pain.  Skin: Negative for itching and rash.  Neurological: Negative for dizziness, tremors, seizures, loss of consciousness and headaches.  Psychiatric/Behavioral: Negative for depression, suicidal ideas, hallucinations and substance abuse. The patient is not nervous/anxious and does not have insomnia.  Blood pressure 130/70, pulse 76, resp. rate 12, weight 177 lb 6.4 oz (80.468 kg).Body mass index is 34.06 kg/(m^2).  General Appearance: Casual  Eye Contact:  Good  Speech:  Clear and Coherent and Normal Rate  Volume:  Normal  Mood:  Euthymic  Affect:  Full Range  Thought Process:  Goal Directed  Orientation:  Full (Time, Place, and Person)  Thought Content:  Negative  Suicidal Thoughts:  No  Homicidal Thoughts:   No  Memory:  Immediate;   Good Recent;   Good Remote;   Good  Judgement:  Good  Insight:  Good  Psychomotor Activity:  Normal  Concentration:  Good  Recall:  Good  Fund of Knowledge: Good  Language: Good  Akathisia:  No  Handed:  Right  AIMS (if indicated):  AIMS:  Facial and Oral Movements  Muscles of Facial Expression: None, normal  Lips and Perioral Area: None, normal  Jaw: None, normal  Tongue: None, normal Extremity Movements: Upper (arms, wrists, hands, fingers): None, normal  Lower (legs, knees, ankles, toes): None, normal,  Trunk Movements:  Neck, shoulders, hips: None, normal,  Overall Severity : Severity of abnormal movements (highest score from questions above): None, normal  Incapacitation due to abnormal movements: None, normal  Patient's awareness of abnormal movements (rate only patient's report): No Awareness, Dental Status  Current problems with teeth and/or dentures?: No  Does patient usually wear dentures?: No     Assets:  Communication Skills Desire for Improvement Financial Resources/Insurance Housing Intimacy Leisure Time Physical Health Resilience Social Support Talents/Skills Transportation Vocational/Educational  ADL's:  Intact  Cognition: WNL  Sleep:  good   Is the patient at risk to self?  No. Has the patient been a risk to self in the past 6 months?  No. Has the patient been a risk to self within the distant past?  No. Is the patient a risk to others?  No. Has the patient been a risk to others in the past 6 months?  No. Has the patient been a risk to others within the distant past?  No.  Current Medications: Current Outpatient Prescriptions  Medication Sig Dispense Refill  . risperiDONE (RISPERDAL) 2 MG tablet Take 1 tablet (2 mg total) by mouth at bedtime. 30 tablet 0   No current facility-administered medications for this visit.    Medical Decision Making:  Established Problem, Stable/Improving (1), Review or order clinical  lab tests (1), Review and summation of old records (2) and Review of Medication Regimen & Side Effects (2)  Treatment Plan Summary:Medication management and Plan see below  Assessment:  Bipolar vs Schizophrenia  Meds: Risperdal  po qHS for mood stabilization. Plan to decrease if pt remains stable.   Medication management with supportive therapy. Risks/benefits and SE of the medication discussed. Pt verbalized understanding and verbal consent obtained for treatment. Affirm with the patient that the medications are taken as ordered. Patient expressed understanding of how their medications were to be used.   Reviewed records from [facility] and previous psychiatrist- consistent with pt's reported hx.   Labs- reviewed EKG sinus tachycardia, QTc 450 Reviewed records and labs- will scan into chart. On 01/11/2015 it showed elevated lipids.  Pt reports symptoms of amenorrhea, hair loss and weight gain that could be SE from Risperdal or due to PCOS. Unclear at this time so will continue to monitor.   Therapy: brief supportive therapy provided. Discussed psychosocial stressors in detail.   Pt denies SI and is at an acute low  risk for suicide.Patient told to call clinic if any problems occur. Patient advised to go to ER if they should develop SI/HI, side effects, or if symptoms worsen. Has crisis numbers to call if needed. Pt verbalized understanding.  F/up in 3 months or sooner if needed   Benzion Mesta 11/04/2015, 2:33 PM

## 2016-02-03 ENCOUNTER — Encounter (HOSPITAL_COMMUNITY): Payer: Self-pay | Admitting: Psychiatry

## 2016-02-03 ENCOUNTER — Ambulatory Visit (INDEPENDENT_AMBULATORY_CARE_PROVIDER_SITE_OTHER): Payer: 59 | Admitting: Psychiatry

## 2016-02-03 VITALS — BP 126/74 | HR 88 | Ht 61.0 in | Wt 173.8 lb

## 2016-02-03 DIAGNOSIS — F23 Brief psychotic disorder: Secondary | ICD-10-CM

## 2016-02-03 MED ORDER — RISPERIDONE 1 MG PO TABS
1.0000 mg | ORAL_TABLET | Freq: Every day | ORAL | Status: DC
Start: 2016-02-03 — End: 2016-05-04

## 2016-02-03 NOTE — Progress Notes (Signed)
Patient ID: Khloee Garza, female   DOB: Dec 14, 1980, 35 y.o.   MRN: [id number] [facility] MD/PA/NP OP Progress Note  02/03/2016 2:36 PM Syble Picco  MRN:  [id number]  Subjective:  Today here with husband.  Pt states she is doing well and denies any concerns at this time.  Pt denies depression. Denies anhedonia, isolation, crying spells, low motivation, poor hygiene, worthlessness and hopelessness. Denies SI/HI.  Denies AVH, ideas of reference and paranoia.   Sleeping 10 hrs/night. Appetite, energy and concentration are good.  Denies manic and hypomanic symptoms including periods of decreased need for sleep, increased energy, mood lability, impulsivity, FOI, and excessive spending.  Husband has no concerns and states pt is doing well.  Taking meds as prescribed and denies SE.    Chief Complaint: doing ok Chief Complaint    Follow-up     Visit Diagnosis:   No diagnosis found.  Past Medical History:  Past Medical History  Diagnosis Date  . Fast heart beat     associated with anxiety  . Bipolar disorder (HCC)   . Schizophrenia (HCC)   . PCOS (polycystic ovarian syndrome)     Past Surgical History  Procedure Laterality Date  . Cesarean section     Past Psych Hx: Dx: Bipolar disorder vs Schizophrenia began in 2012 due to stress and sleep issues. Symptoms included AH, hypersexual, talkative, confusion. Pt doesn't have much memory of the episode and states it lasted for 6 days. Prior had no past psych hx. Meds: Klonopin, Risperdal 3+mg cause rise in Prolactin and lactation, Depakote Previous psychiatrist/therapist: since admission was treated by Dr. Zollie Beckers and a psychiatrist in [place] in 2105.  Hospitalizations: Pt was in [place] for 10 days in Dec 2012. SIB: denies Suicide attempts: denies Hx of violent behavior towards others: denies. Denies current access to guns Hx of abuse: denies  Family History:  Family History  Problem Relation Age of Onset  . Alcohol abuse Neg  Hx   . Anxiety disorder Neg Hx   . Bipolar disorder Neg Hx   . Drug abuse Neg Hx   . Depression Neg Hx   . Schizophrenia Neg Hx   . Suicidality Neg Hx    Social History:  Social History   Social History  . Marital Status: Married    Spouse Name: N/A  . Number of Children: 1  . Years of Education: 72   Social History Main Topics  . Smoking status: Never Smoker   . Smokeless tobacco: None  . Alcohol Use: No  . Drug Use: No  . Sexual Activity: Not Asked   Other Topics Concern  . None   Social History Narrative   Married and living with husband and 5yo son. 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 Grew up in [place]. Pt has a BS in engineering and denies problems in school.    Additional History: n/a   Musculoskeletal: Strength & Muscle Tone: within normal limits Gait & Station: normal Patient leans: straight  Psychiatric Specialty Exam: HPI  Review of Systems  Constitutional: Negative for fever, chills and weight loss.  HENT: Negative for congestion and sore throat.   Eyes: Negative for blurred vision, double vision and redness.  Respiratory: Negative for cough, shortness of breath and wheezing.   Cardiovascular: Negative for chest pain, palpitations and leg swelling.  Gastrointestinal: Negative for heartburn, nausea, vomiting and abdominal pain.  Musculoskeletal: Negative for back pain, joint pain and neck pain.  Skin: Negative  for itching and rash.  Neurological: Negative for dizziness, tremors, seizures, loss of consciousness and headaches.  Psychiatric/Behavioral: Negative for depression, suicidal ideas, hallucinations and substance abuse. The patient is not nervous/anxious and does not have insomnia.     Blood pressure 126/74, pulse 88, height  (1.549 m), weight 173 lb 12.8 oz (78.835 kg), last menstrual period 11/02/2011.Body mass index is 32.86 kg/(m^2).  General Appearance: Casual  Eye Contact:  Good  Speech:  Clear and Coherent and  Normal Rate  Volume:  Normal  Mood:  Euthymic  Affect:  Full Range  Thought Process:  Goal Directed  Orientation:  Full (Time, Place, and Person)  Thought Content:  Negative  Suicidal Thoughts:  No  Homicidal Thoughts:  No  Memory:  Immediate;   Good Recent;   Good Remote;   Good  Judgement:  Good  Insight:  Good  Psychomotor Activity:  Normal  Concentration:  Good  Recall:  Good  Fund of Knowledge: Good  Language: Good  Akathisia:  No  Handed:  Right  AIMS (if indicated):  AIMS:  Facial and Oral Movements  Muscles of Facial Expression: None, normal  Lips and Perioral Area: None, normal  Jaw: None, normal  Tongue: None, normal Extremity Movements: Upper (arms, wrists, hands, fingers): None, normal  Lower (legs, knees, ankles, toes): None, normal,  Trunk Movements:  Neck, shoulders, hips: None, normal,  Overall Severity : Severity of abnormal movements (highest score from questions above): None, normal  Incapacitation due to abnormal movements: None, normal  Patient's awareness of abnormal movements (rate only patient's report): No Awareness, Dental Status  Current problems with teeth and/or dentures?: No  Does patient usually wear dentures?: No     Assets:  Communication Skills Desire for Improvement Financial Resources/Insurance Housing Intimacy Leisure Time Physical Health Resilience Social Support Talents/Skills Transportation Vocational/Educational  ADL's:  Intact  Cognition: WNL  Sleep:  good   Is the patient at risk to self?  No. Has the patient been a risk to self in the past 6 months?  No. Has the patient been a risk to self within the distant past?  No. Is the patient a risk to others?  No. Has the patient been a risk to others in the past 6 months?  No. Has the patient been a risk to others within the distant past?  No.  Current Medications: Current Outpatient Prescriptions  Medication Sig Dispense Refill  . risperiDONE (RISPERDAL) 2 MG  tablet Take 1 tablet (2 mg total) by mouth at bedtime. 90 tablet 0   No current facility-administered medications for this visit.    Medical Decision Making:  Established Problem, Stable/Improving (1), Review or order clinical lab tests (1), Review of Medication Regimen & Side Effects (2) and Review of New Medication or Change in Dosage (2)  Treatment Plan Summary:Medication management and Plan see below  Assessment:  Bipolar vs Schizophrenia  Meds: decrease Risperdal to  po qHS for mood stabilization. Plan to decrease if pt remains stable.   Medication management with supportive therapy. Risks/benefits and SE of the medication discussed. Pt verbalized understanding and verbal consent obtained for treatment. Affirm with the patient that the medications are taken as ordered. Patient expressed understanding of how their medications were to be used.   Reviewed records from [facility] and previous psychiatrist- consistent with pt's reported hx.   Labs- reviewed EKG sinus tachycardia, QTc 450 Reviewed records and labs- will scan into chart. On 01/11/2015 it showed elevated lipids.  Pt  reports symptoms of amenorrhea, hair loss and weight gain that could be SE from Risperdal or due to PCOS. Unclear at this time so will continue to monitor.   Therapy: brief supportive therapy provided. Discussed psychosocial stressors in detail.   Pt denies SI and is at an acute low risk for suicide.Patient told to call clinic if any problems occur. Patient advised to go to ER if they should develop SI/HI, side effects, or if symptoms worsen. Has crisis numbers to call if needed. Pt verbalized understanding.  F/up in 3 months or sooner if needed   Patrice Matthew 02/03/2016, 2:36 PM

## 2016-02-23 ENCOUNTER — Telehealth (HOSPITAL_COMMUNITY): Payer: Self-pay

## 2016-02-23 NOTE — Telephone Encounter (Signed)
Patient calling she states the Risperdol is causing her breasts to ache and she says that she has milk as well. Patient is not pregnant and her youngest child is 6. Please advise, thank you

## 2016-02-24 NOTE — Telephone Encounter (Signed)
Please tell the pt to stop taking Risperdal. It will take several weeks but symptoms should gradually decline. Will f/up in May at scheduled appt unless symptoms worsen.

## 2016-02-24 NOTE — Telephone Encounter (Signed)
I called the patient and advised her to stop taking the Risperdal, patient voiced her understanding and will call if symptoms worsen.

## 2016-05-04 ENCOUNTER — Ambulatory Visit (INDEPENDENT_AMBULATORY_CARE_PROVIDER_SITE_OTHER): Payer: 59 | Admitting: Psychiatry

## 2016-05-04 ENCOUNTER — Encounter (HOSPITAL_COMMUNITY): Payer: Self-pay | Admitting: Psychiatry

## 2016-05-04 VITALS — BP 128/83 | HR 114 | Ht 60.0 in | Wt 164.2 lb

## 2016-05-04 DIAGNOSIS — F23 Brief psychotic disorder: Secondary | ICD-10-CM

## 2016-05-04 NOTE — Progress Notes (Signed)
Patient ID: Jessica Steele, female   DOB: 1981/08/29, 35 y.o.   MRN: [id number] Patient ID: Jessica Steele, female   DOB: 1981/08/29, 35 y.o.   MRN: [id number] [facility] MD/PA/NP OP Progress Note  05/04/2016 2:20 PM Jessica Steele  MRN:  [id number]  Subjective:  Today here with husband.  Pt states she is doing well and denies any concerns at this time. Pt stopped Risperdal about 3 months ago.   Pt denies depression. Denies anhedonia, isolation, crying spells, low motivation, poor hygiene, worthlessness and hopelessness. Denies SI/HI.  Denies AVH, ideas of reference and paranoia.   Sleeping 10 hrs/night. 2 nights a week she may wake up for 10 min and then falls back asleep.  Appetite has decreased and she has lost some weight. Energy and concentration are good.  Denies manic and hypomanic symptoms including periods of decreased need for sleep, increased energy, mood lability, impulsivity, FOI, and excessive spending.  Husband has no concerns and states pt is doing well.   Chief Complaint: doing ok Chief Complaint    Follow-up     Visit Diagnosis:     ICD-9-CM ICD-10-CM   1. 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 Fast heart beat     associated with anxiety  . Bipolar disorder (HCC)   . Schizophrenia (HCC)   . PCOS (polycystic ovarian syndrome)     Past Surgical History  Procedure Laterality Date  . Cesarean section     Past Psych Hx: Dx: Bipolar disorder vs Schizophrenia began in 2012 due to stress and sleep issues. Symptoms included AH, hypersexual, talkative, confusion. Pt doesn't have much memory of the episode and states it lasted for 6 days. Prior had no past psych hx. Meds: Klonopin, Risperdal 3+mg cause rise in Prolactin and lactation, Depakote Previous psychiatrist/therapist: since admission was treated by Dr. Zollie BeckersAduwallia and a psychiatrist in [place] in 2105.  Hospitalizations: Pt was in [facility] for 10 days in  Dec 2012. SIB: denies Suicide attempts: denies Hx of violent behavior towards others: denies. Denies current access to guns Hx of abuse: denies  Family History:  Family History  Problem Relation Age of Onset  . Alcohol abuse Neg Hx   . Anxiety disorder Neg Hx   . Bipolar disorder Neg Hx   . Drug abuse Neg Hx   . Depression Neg Hx   . Schizophrenia Neg Hx   . Suicidality Neg Hx    Social History:  Social History   Social History  . Marital Status: Married    Spouse Name: N/A  . Number of Children: 1  . Years of Education: 6516   Social History Main Topics  . Smoking status: Never Smoker   . Smokeless tobacco: None  . Alcohol Use: No  . Drug Use: No  . Sexual Activity: Not Asked   Other Topics Concern  . None   Social History Narrative   Married and living with husband and 5yo son. 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 Grew up in [place]. Pt has a BS in engineering and denies problems in school.      Musculoskeletal: Strength & Muscle Tone: within normal limits Gait & Station: normal Patient leans: straight  Psychiatric Specialty Exam: HPI  Review of Systems  Constitutional: Negative for fever, chills and weight loss.  HENT: Negative for congestion and sore throat.   Eyes: Negative for blurred vision, double  vision and redness.  Respiratory: Negative for cough, shortness of breath and wheezing.   Cardiovascular: Negative for chest pain, palpitations and leg swelling.  Gastrointestinal: Negative for heartburn, nausea, vomiting and abdominal pain.  Musculoskeletal: Negative for back pain, joint pain and neck pain.  Skin: Negative for itching and rash.  Neurological: Negative for dizziness, tremors, seizures, loss of consciousness and headaches.  Psychiatric/Behavioral: Negative for depression, suicidal ideas, hallucinations and substance abuse. The patient is not nervous/anxious and does not have insomnia.     Blood pressure 128/83, pulse  114, height 5' (1.524 m), weight 164 lb 3.2 oz (74.481 kg), last menstrual period 11/02/2011.Body mass index is 32.07 kg/(m^2).  General Appearance: Casual  Eye Contact:  Good  Speech:  Clear and Coherent and Normal Rate  Volume:  Normal  Mood:  Euthymic  Affect:  Full Range  Thought Process:  Goal Directed  Orientation:  Full (Time, Place, and Person)  Thought Content:  Negative  Suicidal Thoughts:  No  Homicidal Thoughts:  No  Memory:  Immediate;   Good Recent;   Good Remote;   Good  Judgement:  Good  Insight:  Good  Psychomotor Activity:  Normal  Concentration:  Good  Recall:  Good  Fund of Knowledge: Good  Language: Good  Akathisia:  No  Handed:  Right  AIMS (if indicated):  AIMS:  Facial and Oral Movements  Muscles of Facial Expression: None, normal  Lips and Perioral Area: None, normal  Jaw: None, normal  Tongue: None, normal Extremity Movements: Upper (arms, wrists, hands, fingers): None, normal  Lower (legs, knees, ankles, toes): None, normal,  Trunk Movements:  Neck, shoulders, hips: None, normal,  Overall Severity : Severity of abnormal movements (highest score from questions above): None, normal  Incapacitation due to abnormal movements: None, normal  Patient's awareness of abnormal movements (rate only patient's report): No Awareness, Dental Status  Current problems with teeth and/or dentures?: No  Does patient usually wear dentures?: No     Assets:  Communication Skills Desire for Improvement Financial Resources/Insurance Housing Intimacy Leisure Time Physical Health Resilience Social Support Talents/Skills Transportation Vocational/Educational  ADL's:  Intact  Cognition: WNL  Sleep:  good   Is the patient at risk to self?  No. Has the patient been a risk to self in the past 6 months?  No. Has the patient been a risk to self within the distant past?  No. Is the patient a risk to others?  No. Has the patient been a risk to others in the past  6 months?  No. Has the patient been a risk to others within the distant past?  No.  Current Medications: Current Outpatient Prescriptions  Medication Sig Dispense Refill  . risperiDONE (RISPERDAL) 1 MG tablet Take 1 tablet (1 mg total) by mouth at bedtime. (Patient not taking: Reported on 05/04/2016) 90 tablet 0   No current facility-administered medications for this visit.    Medical Decision Making:  Established Problem, Stable/Improving (1), Review of Psycho-Social Stressors (1) and Review of Last Therapy Session (1)  Treatment Plan Summary:Medication management and Plan see below  Assessment:  Bipolar vs Schizophrenia  Meds: d/c Risperdal. Pt is stable and denies any concerns psychotic or mood symptoms. Will monitor for symptom change and treat as needed  Medication management with supportive therapy. Risks/benefits and SE of the medication discussed. Pt verbalized understanding and verbal consent obtained for treatment. Affirm with the patient that the medications are taken as ordered. Patient expressed understanding of how their  medications were to be used.   Reviewed records from [facility] and previous psychiatrist- consistent with pt's reported hx.   Labs- reviewed EKG sinus tachycardia, QTc 450 Reviewed records and labs- will scan into chart. On 01/11/2015 it showed elevated lipids.  Pt reports symptoms of amenorrhea, hair loss and weight gain that could be SE from Risperdal or due to PCOS. Unclear at this time so will continue to monitor.   Therapy: brief supportive therapy provided. Discussed psychosocial stressors in detail.   Pt denies SI and is at an acute low risk for suicide.Patient told to call clinic if any problems occur. Patient advised to go to ER if they should develop SI/HI, side effects, or if symptoms worsen. Has crisis numbers to call if needed. Pt verbalized understanding.  F/up in 4 months or sooner if needed   Oletta Darter 05/04/2016, 2:20 PM

## 2016-09-09 ENCOUNTER — Encounter (HOSPITAL_COMMUNITY): Payer: Self-pay | Admitting: Psychiatry

## 2016-09-09 ENCOUNTER — Ambulatory Visit (INDEPENDENT_AMBULATORY_CARE_PROVIDER_SITE_OTHER): Payer: 59 | Admitting: Psychiatry

## 2016-09-09 VITALS — BP 128/76 | HR 75 | Ht 60.0 in | Wt 166.2 lb

## 2016-09-09 DIAGNOSIS — Z0389 Encounter for observation for other suspected diseases and conditions ruled out: Secondary | ICD-10-CM | POA: Diagnosis not present

## 2016-09-09 DIAGNOSIS — F489 Nonpsychotic mental disorder, unspecified: Secondary | ICD-10-CM

## 2016-09-09 MED ORDER — DIPHENHYDRAMINE HCL 25 MG PO TABS: 25.0000 mg | ORAL_TABLET | Freq: Every evening | ORAL | 0 refills | Status: DC | PRN

## 2016-09-09 NOTE — Progress Notes (Signed)
Patient ID: Jessica Steele, female   DOB: [birth year], 35 y.o.   MRN: [id number] Patient ID: Jessica Steele, female   DOB: [birth year], 35 y.o.   MRN: [id number] [facility] MD/PA/NP OP Progress Note  09/09/2016 2:35 PM Jessica Steele  MRN:  [id number]  Subjective:  Today here with husband.  Pt states she is doing well and denies any concerns at this time.    Pt denies depression. Denies anhedonia, isolation, crying spells, low motivation, poor hygiene, worthlessness and hopelessness. Denies SI/HI.  Denies AVH, ideas of reference and paranoia.   Sleeping 5 hrs/night for the last one month. She is excited because they are closing on a house today. She thinks sleep will improve once they have moved.  Energy and concentration are good.  Denies manic and hypomanic symptoms including periods of decreased need for sleep, increased energy, mood lability, impulsivity, FOI, and excessive spending.    Chief Complaint: doing ok Chief Complaint    Follow-up     Visit Diagnosis:     ICD-9-CM ICD-10-CM   1. No diagnosis on Axis I V71.09 Z03.89     Past Medical History:  Past Medical History:  Diagnosis Date  . Bipolar disorder (HCC)   . Fast heart beat    associated with anxiety  . PCOS (polycystic ovarian syndrome)   . Schizophrenia [facility])     Past Surgical History:  Procedure Laterality Date  . CESAREAN SECTION     Past Psych Hx: Dx: Bipolar disorder vs Schizophrenia began in 2012 due to stress and sleep issues. Symptoms included AH, hypersexual, talkative, confusion. Pt doesn't have much memory of the episode and states it lasted for 6 days. Prior had no past psych hx. Meds: Klonopin, Risperdal 3+mg cause rise in Prolactin and lactation, Depakote Previous psychiatrist/therapist: since admission was treated by Dr. Zollie Beckers and a psychiatrist in [place] in 2105.  Hospitalizations: Pt was in [place] for 10 days in Dec 2012. SIB: denies Suicide attempts: denies Hx of violent behavior towards  others: denies. Denies current access to guns Hx of abuse: denies  Family History:  Family History  Problem Relation Age of Onset  . Alcohol abuse Neg Hx   . Anxiety disorder Neg Hx   . Bipolar disorder Neg Hx   . Drug abuse Neg Hx   . Depression Neg Hx   . Schizophrenia Neg Hx   . Suicidality Neg Hx    Social History:  Social History   Social History  . Marital status: Married    Spouse name: N/A  . Number of children: 1  . Years of education: 75   Social History Main Topics  . Smoking status: Never Smoker  . Smokeless tobacco: None  . Alcohol use No  . Drug use: No  . Sexual activity: Not Asked   Other Topics Concern  . None   Social History Narrative   Married and living with husband and 5yo son. 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 Grew up in [place]. Pt has a BS in engineering and denies problems in school.      Musculoskeletal: Strength & Muscle Tone: within normal limits Gait & Station: normal Patient leans: straight  Psychiatric Specialty Exam: HPI  Review of Systems  Constitutional: Negative for chills, fever and weight loss.  HENT: Negative for congestion and sore throat.   Eyes: Negative for blurred vision, double vision and redness.  Respiratory: Negative for cough, shortness of breath and wheezing.   Cardiovascular:  Negative for chest pain, palpitations and leg swelling.  Gastrointestinal: Negative for abdominal pain, heartburn, nausea and vomiting.  Musculoskeletal: Negative for back pain, joint pain and neck pain.  Skin: Negative for itching and rash.  Neurological: Negative for dizziness, tremors, seizures, loss of consciousness and headaches.  Psychiatric/Behavioral: Negative for depression, hallucinations, substance abuse and suicidal ideas. The patient is not nervous/anxious and does not have insomnia.     Blood pressure 128/76, pulse 75, height 5' (1.524 m), weight 166 lb 3.2 oz (75.4 kg), last menstrual period  11/02/2011.Body mass index is 32.46 kg/m.  General Appearance: Casual  Eye Contact:  Good  Speech:  Clear and Coherent and Normal Rate  Volume:  Normal  Mood:  Euthymic  Affect:  Full Range  Thought Process:  Goal Directed  Orientation:  Full (Time, Place, and Person)  Thought Content:  Negative  Suicidal Thoughts:  No  Homicidal Thoughts:  No  Memory:  Immediate;   Good Recent;   Good Remote;   Good  Judgement:  Good  Insight:  Good  Psychomotor Activity:  Normal  Concentration:  Good  Recall:  Good  Fund of Knowledge: Good  Language: Good  Akathisia:  No  Handed:  Right  AIMS (if indicated):  AIMS:  Facial and Oral Movements  Muscles of Facial Expression: None, normal  Lips and Perioral Area: None, normal  Jaw: None, normal  Tongue: None, normal Extremity Movements: Upper (arms, wrists, hands, fingers): None, normal  Lower (legs, knees, ankles, toes): None, normal,  Trunk Movements:  Neck, shoulders, hips: None, normal,  Overall Severity : Severity of abnormal movements (highest score from questions above): None, normal  Incapacitation due to abnormal movements: None, normal  Patient's awareness of abnormal movements (rate only patient's report): No Awareness, Dental Status  Current problems with teeth and/or dentures?: No  Does patient usually wear dentures?: No     Assets:  Communication Skills Desire for Improvement Financial Resources/Insurance Housing Intimacy Leisure Time Physical Health Resilience Social Support Talents/Skills Transportation Vocational/Educational  ADL's:  Intact  Cognition: WNL  Sleep:  good   Is the patient at risk to self?  No. Has the patient been a risk to self in the past 6 months?  No. Has the patient been a risk to self within the distant past?  No. Is the patient a risk to others?  No. Has the patient been a risk to others in the past 6 months?  No. Has the patient been a risk to others within the distant past?   No.  Current Medications: No current outpatient prescriptions on file.   No current facility-administered medications for this visit.     M  Treatment Plan Summary:Medication management and Plan see below  Assessment:  Brief psychotic disorder- in remission  Meds: Pt is stable and denies any concerns psychotic or mood symptoms. Will monitor for symptom change and treat as needed  Medication management with supportive therapy. Risks/benefits and SE of the medication discussed. Pt verbalized understanding and verbal consent obtained for treatment. Affirm with the patient that the medications are taken as ordered. Patient expressed understanding of how their medications were to be used.  -Benadryl 25mg  po qHS prn insomnia  Reviewed records from [facility] and previous psychiatrist- consistent with pt's reported hx.   Labs- reviewed EKG sinus tachycardia, QTc 450 Reviewed records and labs- will scan into chart. On 01/11/2015 it showed elevated lipids.  Pt reports symptoms of amenorrhea, hair loss and weight gain that could  be SE from Risperdal or due to PCOS. Unclear at this time so will continue to monitor.   Therapy: brief supportive therapy provided. Discussed psychosocial stressors in detail.   Pt denies SI and is at an acute low risk for suicide.Patient told to call clinic if any problems occur. Patient advised to go to ER if they should develop SI/HI, side effects, or if symptoms worsen. Has crisis numbers to call if needed. Pt verbalized understanding.  F/up in 4 months or sooner if needed   Oletta Darter 09/09/2016, 2:35 PM

## 2016-09-16 ENCOUNTER — Ambulatory Visit (HOSPITAL_COMMUNITY): Admission: RE | Admit: 2016-09-16 | Payer: 59 | Source: Home / Self Care | Admitting: Psychiatry

## 2016-09-16 ENCOUNTER — Other Ambulatory Visit (HOSPITAL_COMMUNITY): Payer: Self-pay | Admitting: Psychiatry

## 2016-09-16 MED ORDER — RISPERIDONE 1 MG PO TABS: 1.0000 mg | ORAL_TABLET | Freq: Every day | ORAL | 2 refills | Status: DC

## 2016-09-16 NOTE — Progress Notes (Signed)
[name] came with her husband directly from an assessment via TTS.  She needed a refill for risperidone.  Her husband said she is not sleeping at night but is up doing other things that do not need to be done.  She stopped her meds in March because she believed she did not need them and did okay for several months but has begun slipping into what in the past resulted in manic episodes.  She does not see this at all, saying she is okay and that she will not take the medication if re-ordered.   Plan:  Make appointment with Dr Jessica Steele  Called in risperidone 1 mg hs # 30 with 2 refills.

## 2016-09-16 NOTE — Progress Notes (Signed)
Clarified prescription as called in to [facility] [address] as requested by her husband, not [facility] as listed in [employer]

## 2016-09-18 ENCOUNTER — Ambulatory Visit (HOSPITAL_COMMUNITY)
Admission: RE | Admit: 2016-09-18 | Discharge: 2016-09-18 | Disposition: A | Discharge status: Home / Self Care | Payer: 59 | Attending: Psychiatry | Admitting: Psychiatry

## 2016-09-18 ENCOUNTER — Encounter (HOSPITAL_COMMUNITY): Payer: Self-pay

## 2016-09-18 ENCOUNTER — Emergency Department (HOSPITAL_COMMUNITY)
Admission: EM | Admit: 2016-09-18 | Discharge: 2016-09-19 | Disposition: A | Discharge status: Home / Self Care | Payer: 59 | Attending: Emergency Medicine | Admitting: Emergency Medicine

## 2016-09-18 DIAGNOSIS — G4700 Insomnia, unspecified: Secondary | ICD-10-CM | POA: Diagnosis not present

## 2016-09-18 DIAGNOSIS — Z5321 Procedure and treatment not carried out due to patient leaving prior to being seen by health care provider: Secondary | ICD-10-CM | POA: Insufficient documentation

## 2016-09-18 NOTE — BH Assessment (Signed)
Tele Assessment Note   Jessica Steele is an 35 y.o. female, Hindu Culture, Married who presents to [facility] as walk in w c/o problems controlling anger. Patient was not very verbal, and did not elaborate on anger / aggression problems. Patient states that she is not sure why she is here. Patient states that she does have a therapist and has been prescribed Risperdone and Clonazepam and has been taking x 2 days. Patient  Currently resides with husband and her younger son. Patient likely has cultural needs and barriers exist due to culture. Patient is not very talkative and seems distant around males while away from husband. Please be aware of cultural considerations.   Patient denies current or hx. Of SI and HI. Patient denies AVH and S.A. Patient denies inpatient psych care, but per [facility] was [facility] at [facility] in 2012 for Bipolar. Patient states she is currently seen outpatient with Dr. Theadora Rama. Patient denies any hx. Of S.A. Or treatment of.  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 Patient speech was within normal limits and motor behavior appeared normal. Patient thought process is coherent. Patient does not appear to be responding to internal stimuli. Patient was cooperative throughout the assessment.   Diagnosis: Bipolar Disorder  Past Medical History:  Past Medical History:  Diagnosis Date  . Bipolar disorder (HCC)   . Fast heart beat    associated with anxiety  . PCOS (polycystic ovarian syndrome)   . Schizophrenia [facility])     Past Surgical History:  Procedure Laterality Date  . CESAREAN SECTION      Family History:  Family History  Problem Relation Age of Onset  . Alcohol abuse Neg Hx   . Anxiety disorder Neg Hx   . Bipolar disorder Neg Hx   . Drug abuse Neg Hx   . Depression Neg Hx   . Schizophrenia Neg Hx   . Suicidality Neg Hx     Social History:  reports that she has never smoked. She has never used smokeless tobacco. She  reports that she does not drink alcohol or use drugs.  Additional Social History:  Alcohol / Drug Use Pain Medications: SEE MAR Prescriptions: SEE MAR Over the Counter: SEE MAR History of alcohol / drug use?: No history of alcohol / drug abuse  CIWA:   COWS:    PATIENT STRENGTHS: (choose at least two) Active sense of humor Average or above average intelligence Communication skills  Allergies: No Known Allergies  Home Medications:  (Not in a hospital admission)  OB/GYN Status:  Patient's last menstrual period was 11/02/2011.  General Assessment Data Location of Assessment: [facility] Assessment Services TTS Assessment: In system Is this a Tele or Face-to-Face Assessment?: Face-to-Face Is this an Initial Assessment or a Re-assessment for this encounter?: Initial Assessment Marital status: Married Is patient pregnant?: No Pregnancy Status: No Living Arrangements: Spouse/significant other Can pt return to current living arrangement?: Yes Admission Status: Voluntary Is patient capable of signing voluntary admission?: Yes Referral Source: Self/Family/Friend Insurance type: [place] [facility]  Medical Screening Exam [facility] Walk-in ONLY) Medical Exam completed: Yes  Crisis Care Plan Living Arrangements: Spouse/significant other Name of Psychiatrist: Dr. Theadora Rama Name of Therapist: Dr. Theadora Rama  Education Status Is patient currently in school?: No Current Grade: n/a Highest grade of school patient has completed: unspecified Name of school: n/a Contact person: husband  Risk to self with the past 6 months Suicidal Ideation: No Has patient been a risk to self  within the past 6 months prior to admission? : No Suicidal Intent: No Has patient had any suicidal intent within the past 6 months prior to admission? : No Is patient at risk for suicide?: No Suicidal Plan?: No Has patient had any suicidal plan within the past 6 months prior to admission? : No Access to Means: No What has  been your use of drugs/alcohol within the last 12 months?: none Previous Attempts/Gestures: No How many times?: 0 Other Self Harm Risks: none noted Triggers for Past Attempts: None known Intentional Self Injurious Behavior: None Family Suicide History: No Recent stressful life event(s): Other (Comment) Persecutory voices/beliefs?: No Depression: Yes Depression Symptoms: Despondent, Insomnia, Tearfulness, Isolating, Fatigue, Guilt, Loss of interest in usual pleasures, Feeling worthless/self pity, Feeling angry/irritable Substance abuse history and/or treatment for substance abuse?: No Suicide prevention information given to non-admitted patients: Not applicable  Risk to Others within the past 6 months Homicidal Ideation: No Does patient have any lifetime risk of violence toward others beyond the six months prior to admission? : No Thoughts of Harm to Others: No Current Homicidal Intent: No Current Homicidal Plan: No Access to Homicidal Means: No Identified Victim: none History of harm to others?: No Assessment of Violence: None Noted Violent Behavior Description: none noted Does patient have access to weapons?: No Criminal Charges Pending?: No Does patient have a court date: No Is patient on probation?: No  Psychosis Hallucinations: None noted Delusions: None noted  Mental Status Report Appearance/Hygiene: Unremarkable Eye Contact: Good Motor Activity: Freedom of movement Speech: Logical/coherent Level of Consciousness: Alert Mood: Depressed Affect: Depressed Anxiety Level: Minimal Thought Processes: Coherent, Relevant Judgement: Unimpaired Orientation: Person, Place, Time, Situation, Appropriate for developmental age Obsessive Compulsive Thoughts/Behaviors: None  Cognitive Functioning Concentration: Normal Memory: Recent Intact, Remote Intact IQ: Average Insight: Fair Impulse Control: Poor Appetite: Fair Weight Loss: 0 (0) Weight Gain: 0 Sleep:  Decreased Total Hours of Sleep: 4 Vegetative Symptoms: None  ADLScreening [facility] Assessment Services) Patient's cognitive ability adequate to safely complete daily activities?: Yes Patient able to express need for assistance with ADLs?: Yes Independently performs ADLs?: Yes (appropriate for developmental age)  Prior Inpatient Therapy Prior Inpatient Therapy: Yes Prior Therapy Dates: 2012 [facility] Reason for Treatment: Bipolar  Prior Outpatient Therapy Prior Outpatient Therapy: Yes Prior Therapy Dates: current Prior Therapy Facilty/Provider(s): Dr. Thead[facility] Reason for Treatment: Bipolar Does patient have an ACCT team?: No Does patient have Intensive In-House Services?  : No Does patient have [facility] services? : No Does patient have P4CC services?: No  ADL Screening (condition at time of admission) Patient's cognitive ability adequate to safely complete daily activities?: Yes Is the patient deaf or have difficulty hearing?: No Does the patient have difficulty seeing, even when wearing glasses/contacts?: No Does the patient have difficulty concentrating, remembering, or making decisions?: No Patient able to express need for assistance with ADLs?: Yes Does the patient have difficulty dressing or bathing?: No Independently performs ADLs?: Yes (appropriate for developmental age) Does the patient have difficulty walking or climbing stairs?: No Weakness of Legs: None Weakness of Arms/Hands: None       Abuse/Neglect Assessment (Assessment to be complete while patient is alone) Physical Abuse: Denies Verbal Abuse: Denies Sexual Abuse: Denies Exploitation of patient/patient's resources: Denies Self-Neglect: Denies Values / Beliefs Cultural Requests During Hospitalization: None Spiritual Requests During Hospitalization: None   Advance Directives (For Healthcare) Does patient have an advance directive?: No    Additional Information 1:1 In Past 12  Months?: No CIRT  Risk: No Elopement Risk: No Does patient have medical clearance?: Yes     Disposition: Per Jacki Cones, NP does not meet inpatient criteria Disposition Initial Assessment Completed for this Encounter: Yes Disposition of Patient: Other dispositions (TBD)  Hipolito Bayley 09/18/2016 4:13 PM

## 2016-09-18 NOTE — H&P (Signed)
Behavioral Health Medical Screening Exam  Jessica Steele is an 35 y.o. female.  Total Time spent with patient: 15 minutes  Psychiatric Specialty Exam: Physical Exam  Constitutional: She is oriented to person, place, and time. She appears well-developed and well-nourished.  Eyes: EOM are normal. Pupils are equal, round, and reactive to light.  Neck: Normal range of motion.  Cardiovascular: Normal rate and normal heart sounds.   GI: Soft. Bowel sounds are normal.  Musculoskeletal: Normal range of motion.  Neurological: She is alert and oriented to person, place, and time.  Skin: Skin is warm and dry.    Review of Systems  Psychiatric/Behavioral: Positive for depression.  All other systems reviewed and are negative.   Blood pressure 130/78, temperature 98.2 F (36.8 C), temperature source Oral, resp. rate 14, last menstrual period 11/02/2011, SpO2 100 %.There is no height or weight on file to calculate BMI.  General Appearance: Casual and Fairly Groomed  Eye Contact:  Minimal  Speech:  Normal Rate  Volume:  Decreased  Mood:  Depressed  Affect:  Congruent and Depressed  Thought Process:  Coherent  Orientation:  Full (Time, Place, and Person)  Thought Content:  Logical  Suicidal Thoughts:  No  Homicidal Thoughts:  No  Memory:  Immediate;   Good Recent;   Good Remote;   Fair  Judgement:  Fair  Insight:  Fair  Psychomotor Activity:  Normal  Concentration: Concentration: Good and Attention Span: Good  Recall:  Good  Fund of Knowledge:Fair  Language: Good  Akathisia:  No  Handed:  Right  AIMS (if indicated):     Assets:  Communication Skills Desire for Improvement Financial Resources/Insurance Housing Resilience Social Support  Sleep:       Musculoskeletal: Strength & Muscle Tone: within normal limits Gait & Station: normal Patient leans: N/A  Blood pressure 130/78, temperature 98.2 F (36.8 C), temperature source Oral, resp. rate 14, last menstrual period  11/02/2011, SpO2 100 %.  Recommendations:  Pt has out patient services in place, will follow up with PCP and outpatient therapy for additional help.   Based on my evaluation the patient does not appear to have an emergency medical condition.  Laveda AbbeLaurie Britton Parks, NP 09/18/2016, 5:45 PM

## 2016-09-18 NOTE — ED Notes (Signed)
Bed: WLPT4 Expected date:  Expected time:  Means of arrival:  Comments: 

## 2016-09-18 NOTE — ED Triage Notes (Signed)
Patient bib by family states that patient has hx of bi-polar and was taken off medications in April of this year.  Patient recently travelled to [place] and came back in July and has been unable to sleep a full night.  Patients husbands states that patient has had less then 2 hours of sleep in the past two days.  He also informs that patient x3 days ago patient was put back on medications Risperdal 1mg . Clonopin 1mg  and neither of these are helping her.  Patient states that when she is up she is always having thoughts of her mother "I miss my mom"  Patient denies SI.

## 2016-09-18 NOTE — ED Notes (Signed)
Bed: WTR5 Expected date:  Expected time:  Means of arrival:  Comments: 

## 2016-09-19 ENCOUNTER — Encounter (HOSPITAL_COMMUNITY): Payer: Self-pay | Admitting: *Deleted

## 2016-09-19 ENCOUNTER — Inpatient Hospital Stay (HOSPITAL_COMMUNITY)
Admission: AD | Admit: 2016-09-19 | Discharge: 2016-10-01 | DRG: 885 | Disposition: A | Discharge status: Home / Self Care | Payer: 59 | Attending: Psychiatry | Admitting: Psychiatry

## 2016-09-19 DIAGNOSIS — E785 Hyperlipidemia, unspecified: Secondary | ICD-10-CM | POA: Diagnosis present

## 2016-09-19 DIAGNOSIS — G4700 Insomnia, unspecified: Secondary | ICD-10-CM | POA: Diagnosis present

## 2016-09-19 DIAGNOSIS — Z818 Family history of other mental and behavioral disorders: Secondary | ICD-10-CM | POA: Diagnosis not present

## 2016-09-19 DIAGNOSIS — Z79899 Other long term (current) drug therapy: Secondary | ICD-10-CM | POA: Diagnosis not present

## 2016-09-19 DIAGNOSIS — F4010 Social phobia, unspecified: Secondary | ICD-10-CM | POA: Diagnosis present

## 2016-09-19 DIAGNOSIS — F3164 Bipolar disorder, current episode mixed, severe, with psychotic features: Principal | ICD-10-CM | POA: Diagnosis present

## 2016-09-19 DIAGNOSIS — Z813 Family history of other psychoactive substance abuse and dependence: Secondary | ICD-10-CM | POA: Diagnosis not present

## 2016-09-19 DIAGNOSIS — Z811 Family history of alcohol abuse and dependence: Secondary | ICD-10-CM | POA: Diagnosis not present

## 2016-09-19 DIAGNOSIS — F312 Bipolar disorder, current episode manic severe with psychotic features: Secondary | ICD-10-CM | POA: Diagnosis present

## 2016-09-19 DIAGNOSIS — E282 Polycystic ovarian syndrome: Secondary | ICD-10-CM | POA: Diagnosis present

## 2016-09-19 HISTORY — DX: Major depressive disorder, single episode, unspecified: F32.9

## 2016-09-19 HISTORY — DX: Depression, unspecified: F32.A

## 2016-09-19 HISTORY — DX: Anxiety disorder, unspecified: F41.9

## 2016-09-19 MED ORDER — TRAZODONE HCL 50 MG PO TABS
50.0000 mg | ORAL_TABLET | Freq: Every evening | ORAL | Status: DC | PRN
  Administered 2016-09-19 – 2016-09-21 (×5): 50 mg via ORAL
  Filled 2016-09-19 (×5): qty 1

## 2016-09-19 MED ORDER — LAMOTRIGINE 25 MG PO TABS
25.0000 mg | ORAL_TABLET | Freq: Two times a day (BID) | ORAL | Status: DC
  Administered 2016-09-19 – 2016-09-28 (×20): 25 mg via ORAL
  Filled 2016-09-19 (×25): qty 1

## 2016-09-19 MED ORDER — LORAZEPAM 1 MG PO TABS
2.0000 mg | ORAL_TABLET | Freq: Once | ORAL | Status: AC
  Administered 2016-09-19: 2 mg via ORAL
  Filled 2016-09-19: qty 2

## 2016-09-19 MED ORDER — ALUM & MAG HYDROXIDE-SIMETH 200-200-20 MG/5ML PO SUSP: 30.0000 mL | ORAL | Status: DC | PRN

## 2016-09-19 MED ORDER — MAGNESIUM HYDROXIDE 400 MG/5ML PO SUSP
30.0000 mL | Freq: Every day | ORAL | Status: DC | PRN
  Administered 2016-09-24: 30 mL via ORAL
  Filled 2016-09-19: qty 30

## 2016-09-19 MED ORDER — TRAZODONE HCL 50 MG PO TABS: 50.0000 mg | ORAL_TABLET | Freq: Every evening | ORAL | Status: DC | PRN

## 2016-09-19 MED ORDER — CLONAZEPAM 0.5 MG PO TABS
0.5000 mg | ORAL_TABLET | Freq: Two times a day (BID) | ORAL | Status: DC
  Administered 2016-09-19 – 2016-09-20 (×3): 0.5 mg via ORAL
  Filled 2016-09-19 (×3): qty 1

## 2016-09-19 MED ORDER — LORAZEPAM 2 MG/ML IJ SOLN: 2.0000 mg | Freq: Once | INTRAMUSCULAR | Status: AC

## 2016-09-19 MED ORDER — DIPHENHYDRAMINE HCL 50 MG PO CAPS
50.0000 mg | ORAL_CAPSULE | Freq: Once | ORAL | Status: AC
  Administered 2016-09-19: 50 mg via ORAL
  Filled 2016-09-19: qty 1
  Filled 2016-09-19: qty 2

## 2016-09-19 MED ORDER — RISPERIDONE 2 MG PO TBDP
2.0000 mg | ORAL_TABLET | Freq: Once | ORAL | Status: AC
  Administered 2016-09-19: 2 mg via ORAL
  Filled 2016-09-19: qty 2
  Filled 2016-09-19: qty 1

## 2016-09-19 MED ORDER — DIPHENHYDRAMINE HCL 50 MG/ML IJ SOLN
50.0000 mg | Freq: Once | INTRAMUSCULAR | Status: AC
Start: 2016-09-19 — End: 2016-09-19
  Filled 2016-09-19: qty 1

## 2016-09-19 MED ORDER — ACETAMINOPHEN 325 MG PO TABS
650.0000 mg | ORAL_TABLET | Freq: Four times a day (QID) | ORAL | Status: DC | PRN
  Filled 2016-09-19: qty 2

## 2016-09-19 MED ORDER — LURASIDONE HCL 40 MG PO TABS
40.0000 mg | ORAL_TABLET | Freq: Every day | ORAL | Status: DC
  Administered 2016-09-20: 40 mg via ORAL
  Filled 2016-09-19 (×2): qty 1
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
## 2016-09-19 NOTE — H&P (Signed)
Behavioral Health Medical Screening Exam  Jessica Steele is an 35 y.o. female.  Total Time spent with patient: 45 minutes  Psychiatric Specialty Exam: Physical Exam  Constitutional: She is oriented to person, place, and time. She appears well-developed and well-nourished.  HENT:  Head: Normocephalic and atraumatic.  Eyes: EOM are normal. Pupils are equal, round, and reactive to light.  Neck: Normal range of motion.  Cardiovascular: Normal rate and normal heart sounds.   Respiratory: Effort normal and breath sounds normal.  GI: Soft. Bowel sounds are normal.  Musculoskeletal: Normal range of motion.  Neurological: She is alert and oriented to person, place, and time.  Skin: Skin is warm and dry.    Review of Systems  Psychiatric/Behavioral: Positive for depression and hallucinations (auditory). Negative for memory loss, substance abuse and suicidal ideas. The patient has insomnia. The patient is not nervous/anxious.   All other systems reviewed and are negative.   Blood pressure 125/75, pulse (!) 119, temperature 99.1 F (37.3 C), temperature source Oral, resp. rate 18, height 5\' 2"  (1.575 m), weight 75.8 kg (167 lb), last menstrual period 11/02/2011, SpO2 100 %.Body mass index is 30.54 kg/m.  General Appearance: Casual and Well Groomed  Eye Contact:  Fair  Speech:  Clear and Coherent and Normal Rate  Volume:  Normal  Mood:  Anxious and Depressed  Affect:  Non-Congruent and Labile  Thought Process:  Disorganized  Orientation:  Full (Time, Place, and Person)  Thought Content:  Illogical and Hallucinations: Auditory  Suicidal Thoughts:  No  Homicidal Thoughts:  No  Memory:  Immediate;   Good Recent;   Fair Remote;   Fair  Judgement:  Fair  Insight:  Fair  Psychomotor Activity:  Increased  Concentration: Concentration: Fair and Attention Span: Fair  Recall:  [employer]  Fund of Knowledge:Good  Language: Fair  Akathisia:  No  Handed:  Right  AIMS (if indicated):     Assets:   Communication Skills Desire for Improvement Financial Resources/Insurance Housing Physical Health Resilience Social Support  Sleep:       Musculoskeletal: Strength & Muscle Tone: within normal limits Gait & Station: normal Patient leans: Left  Blood pressure 125/75, pulse (!) 119, temperature 99.1 F (37.3 C), temperature source Oral, resp. rate 18, height 5\' 2"  (1.575 m), weight 75.8 kg (167 lb), last menstrual period 11/02/2011, SpO2 100 %.  Recommendations:  Based on my evaluation the patient does not appear to have an emergency medical condition.   Pt requires inpatient psychiatric admission.   Laveda AbbeLaurie Britton Emanuel Dowson, NP 09/19/2016, 4:53 PM

## 2016-09-19 NOTE — ED Provider Notes (Signed)
PATIENT LEFT PRIOR TO US SEEING HER. She left after being triaged. We were intubating a critical patient after she was bedded.   Derwood KaplanAnkit Darryl Blumenstein, MD 09/19/16 1750

## 2016-09-19 NOTE — Progress Notes (Addendum)
Continuous Observation: Patient walked down hallway, went into 2 patient's rooms and kicked doors.  MHT's escorted patient to her room. [facility] and [facility] informed.  Continuous observation started on patient.

## 2016-09-19 NOTE — Progress Notes (Addendum)
Continuous observation: Patient has been laying in her bed.  Benadryl 50 mg po, ativan 2 mg po and risperdal M-tab disintegrating  2 mg po given patient per MD order.  MHT with patient in her room while patient is resting.  Patient took medication p.o. With no problems.  Patient has asked about her husband coming to visit her.  Respirations even and unlabored.  No signs/symptoms of pain/distress noted on patient's face/body movements. Safety maintained with continuous observation per MD order.

## 2016-09-19 NOTE — Tx Team (Signed)
Initial Treatment Plan 09/19/2016 4:26 PM Lauro Franklineepa Schnurr UJW:[id number]    PATIENT STRESSORS: Financial difficulties Marital or family conflict Occupational concerns   PATIENT STRENGTHS: Ability for insight Average or above average intelligence Capable of independent living Wellsite geologistCommunication skills General fund of knowledge Motivation for treatment/growth Supportive family/friends Work skills   PATIENT IDENTIFIED PROBLEMS: "depression"  "anxiety"  "panic"  "visual hallucination"               DISCHARGE CRITERIA:  Ability to meet basic life and health needs Adequate post-discharge living arrangements Improved stabilization in mood, thinking, and/or behavior Medical problems require only outpatient monitoring Motivation to continue treatment in a less acute level of care Need for constant or close observation no longer present Reduction of life-threatening or endangering symptoms to within safe limits Safe-care adequate arrangements made Verbal commitment to aftercare and medication compliance  PRELIMINARY DISCHARGE PLAN: Attend aftercare/continuing care group Attend PHP/IOP Outpatient therapy Participate in family therapy Return to previous living arrangement  PATIENT/FAMILY INVOLVEMENT: This treatment plan has been presented to and reviewed with the patient, Nayali Vanhorne..  The patient and family have been given the opportunity to ask questions and make suggestions.  Quintella ReichertKnight, Jaycen Vercher FormosoShephard, RN 09/19/2016, 4:26 PM

## 2016-09-19 NOTE — ED Provider Notes (Deleted)
[facility]-EMERGENCY DEPT Provider Note   CSN: [id number] Arrival date & time: 09/18/16  2147   By signing my name below, I, Suzan SlickAshley N. Elon SpannerLeger, attest that this documentation has been prepared under the direction and in the presence of Derwood KaplanAnkit Lailyn Appelbaum, MD.  Electronically Signed: Suzan SlickAshley N. Elon SpannerLeger, ED Scribe. 09/19/16. 1:21 AM.   History   Chief Complaint Chief Complaint  Patient presents with  . Insomnia   HPI  HPI Comments: Jessica Steele is a 35 y.o. female who presents to the Emergency Department complaining of insomnia.  PATIENT LEFT PRIOR TO US SEEING her.  PCP: Per Patient No Pcp (Inactive)    Past Medical History:  Diagnosis Date  . Bipolar disorder (HCC)   . Fast heart beat    associated with anxiety  . PCOS (polycystic ovarian syndrome)   . Schizophrenia [facility](HCC)     Patient Active Problem List   Diagnosis Date Noted  . Brief psychotic disorder 09/02/2015    Past Surgical History:  Procedure Laterality Date  . CESAREAN SECTION      OB History    No data available       Home Medications    Prior to Admission medications   Medication Sig Start Date End Date Taking? Authorizing Provider  clonazePAM (KLONOPIN) 1 MG tablet Take 1 mg by mouth 2 (two) times daily as needed for anxiety.   Yes Historical Provider, MD  diphenhydrAMINE (BENADRYL) 25 MG tablet Take 1 tablet (25 mg total) by mouth at bedtime as needed for sleep. 09/09/16  Yes Oletta DarterSalina Agarwal, MD  risperiDONE (RISPERDAL) 1 MG tablet Take 1 tablet (1 mg total) by mouth at bedtime. 09/16/16  Yes Benjaman PottGerald D Taylor, MD    Family History Family History  Problem Relation Age of Onset  . Alcohol abuse Neg Hx   . Anxiety disorder Neg Hx   . Bipolar disorder Neg Hx   . Drug abuse Neg Hx   . Depression Neg Hx   . Schizophrenia Neg Hx   . Suicidality Neg Hx     Social History Social History  Substance Use Topics  . Smoking status: Never Smoker  . Smokeless tobacco: Never Used  . Alcohol use No      Allergies   Review of patient's allergies indicates no known allergies.   Review of Systems Review of Systems   Physical Exam Updated Vital Signs BP 125/82 (BP Location: Left Arm)   Pulse 111   Temp 97.9 F (36.6 C) (Oral)   Resp 14   Ht 5' (1.524 m)   Wt 164 lb 5 oz (74.5 kg)   LMP 11/02/2011   SpO2 98%   BMI 32.09 kg/m   Physical Exam   ED Treatments / Results   DIAGNOSTIC STUDIES: Oxygen Saturation is 98% on RA, Normal by my interpretation.    COORDINATION OF CARE: 1:21 AM-Discussed treatment plan with pt at bedside and pt agreed to plan.     Labs (all labs ordered are listed, but only abnormal results are displayed) Labs Reviewed - No data to display  EKG  EKG Interpretation None       Radiology No results found.  Procedures Procedures (including critical care time)  Medications Ordered in ED Medications - No data to display   Initial Impression / Assessment and Plan / ED Course  I have reviewed the triage vital signs and the nursing notes.  Pertinent labs & imaging results that were available during my care of the patient were reviewed by  me and considered in my medical decision making (see chart for details).  Clinical Course      Final Clinical Impressions(s) / ED Diagnoses   Final diagnoses:  None    New Prescriptions New Prescriptions   No medications on file      Derwood Kaplan, MD 09/19/16 [phone]

## 2016-09-19 NOTE — Progress Notes (Signed)
Continuous Observation:  Patient's husband visited patient tonight.  Patient ate approximately 20% of her dinner.  Patient sitting on bed beside her husband.  Respirations even and unlabored.  No signs/symptoms of pain/distress noted on patient's face/body movements. Continuous Observation continues per MD order.

## 2016-09-19 NOTE — BH Assessment (Addendum)
Tele Assessment Note  [facility] by her husband, Jessica Steele. Pt's affect is labile and restless. She is dressed appropriately for the weather. She cries at some points, and at other times she smiles and giggles. Sometimes pt closes eyes and rests head on husband's shoulder. Pt's speech is circumstantial and tangential. More than once, pt says, "Be true to yourself. Listen to your conscience." Per chart review, Pt [facility] On 09/16/16 Dr Ladona Ridgelaylor called in risperidone 1 mg for pt.  Apparently, [facility].  Collateral info provided by husband, Jessica Steele. He reports pt has barely slept in approx. 4 days. Per chart review, pt was [facility] in 2012. He sts pt hasn't been on any psych meds since April 2017 when she stopped taking meds b/c of side effects. He reports [place] visited [place] and returned this july. Husband reports lately pt has been screaming at their 696 yo son and at him. He says she is slamming doors and walking in and outside several times an hour. Husband reports pt put cotton in her ears last night b/c she was "hearing voices". He says that pt thought characters on a tv show last night were talking about her.  Jessica FranklinDeepa Steele is an 35 y.o. female.   Diagnosis: Bipolar I Disorder, Current Episode Manic, with Mixed Features  Past Medical History:  Past Medical History:  Diagnosis Date  . Bipolar disorder (HCC)   . Fast heart beat    associated with anxiety  . PCOS (polycystic ovarian syndrome)   . Schizophrenia [facility](HCC)     Past Surgical History:  Procedure Laterality Date  . CESAREAN SECTION      Family History:  Family History  Problem Relation Age of Onset  . Alcohol abuse Neg Hx   . Anxiety disorder Neg Hx   . Bipolar disorder Neg Hx   . Drug abuse Neg Hx   . Depression Neg Hx   .  Schizophrenia Neg Hx   . Suicidality Neg Hx     Social History:  reports that she has never smoked. She has never used smokeless tobacco. She reports that she does not drink alcohol or use drugs.  Additional Social History:  Alcohol / Drug Use Pain Medications: pt denies abuse - see pta meds list Prescriptions: pt denies abuse - see pta meds list Over the Counter: pt denies abuse - see pta meds list History of alcohol / drug use?: No history of alcohol / drug abuse  CIWA: CIWA-Ar BP: 125/82 Pulse Rate: 111 COWS:    PATIENT STRENGTHS: (choose at least two) Average or above average intelligence Communication skills Physical Health Work skills  Allergies: No Known Allergies  Home Medications:  (Not in a hospital admission)  OB/GYN Status:  Patient's last menstrual period was 11/02/2011.  General Assessment Data [facility] T[facility] Assessment: In system Is this a Tele or Face-to-Face Assessment?: Face-to-Face Is this an Initial Assessment or a Re-assessment for this encounter?: Initial Assessment Marital status: Married Is patient pregnant?: Unknown Pregnancy Status: Unknown Living Arrangements: Spouse/significant other, Children (husband, 466 yo son) Can pt return to current living arrangement?: Yes Admission Status: Voluntary Is patient capable of signing voluntary admission?: Yes Referral Source: Self/Family/Friend Insurance type: united healthcare     Crisis Care Plan Living Arrangements: Spouse/significant other,  Children (husband, 42 yo son) Name of Psychiatrist: Dr Oletta Darter Name of Therapist: Dr Michae Kava  Education Status Is patient currently in school?: No  Risk to self with the past 6 months Suicidal Ideation: No Has patient been a risk to self within the past 6 months prior to admission? : No Suicidal Intent: No Has patient had any suicidal intent within the past 6 months prior to admission? : No Is patient at risk  for suicide?: No Suicidal Plan?: No Has patient had any suicidal plan within the past 6 months prior to admission? : No Access to Means: No What has been your use of drugs/alcohol within the last 12 months?: none Previous Attempts/Gestures: No How many times?: 0 Other Self Harm Risks: none Triggers for Past Attempts:  (n/a) Intentional Self Injurious Behavior: None Family Suicide History: No Recent stressful life event(s):  (misses old job) Persecutory voices/beliefs?:  (unable to assess) Depression: Yes Depression Symptoms: Insomnia, Tearfulness, Feeling angry/irritable Substance abuse history and/or treatment for substance abuse?: No Suicide prevention information given to non-admitted patients: Not applicable  Risk to Others within the past 6 months Homicidal Ideation: No Does patient have any lifetime risk of violence toward others beyond the six months prior to admission? : No Thoughts of Harm to Others: No Current Homicidal Intent: No Current Homicidal Plan: No Access to Homicidal Means: No Identified Victim: none History of harm to others?: No Assessment of Violence: None Noted Violent Behavior Description: pt denies hx violence Does patient have access to weapons?: No Criminal Charges Pending?: No Does patient have a court date: No Is patient on probation?: No  Psychosis Hallucinations: Auditory (hearing voices so put cotton in ears) Delusions:  (tv characters talking about her)  Mental Status Report Appearance/Hygiene: Unremarkable Eye Contact: Fair Motor Activity: Freedom of movement, Restlessness Speech: Logical/coherent Level of Consciousness: Restless, Crying, Alert, Quiet/awake Mood: Irritable Affect: Labile, Depressed, Sad, Euphoric Anxiety Level: None Thought Processes: Circumstantial, Tangential Judgement: Impaired Orientation: Person, Place, Time, Situation Obsessive Compulsive Thoughts/Behaviors: None  Cognitive Functioning Concentration:  Decreased Memory: Recent Impaired, Remote Intact IQ: Average Insight: Poor Impulse Control: Poor Appetite:  (pt eating snacks during assessment) Sleep: Decreased Total Hours of Sleep: 2 Vegetative Symptoms: None  [facility]) Patient's cognitive ability adequate to safely complete daily activities?: Yes Patient able to express need for assistance with ADLs?: Yes Independently performs ADLs?: Yes (appropriate for developmental age)  Prior Inpatient Therapy Prior Inpatient Therapy: Yes Prior Therapy Dates: 2012 Prior Therapy Facilty/Provider(s): [facility] Reason for Treatment: psychosis, bipolar d/o  Prior Outpatient Therapy Prior Outpatient Therapy: Yes Prior Therapy Dates: currently Prior Therapy Facilty/Provider(s): Dr [facility]ae [facility] at [facility] Reason for Treatment: bipolar, med management Does patient have an ACCT team?: No Does patient have Intensive In-House Services?  : No Does patient have [facility] services? : No Does patient have P4CC services?: No  ADL Screening (condition at time of admission) Patient's cognitive ability adequate to safely complete daily activities?: Yes Is the patient deaf or have difficulty hearing?: No Does the patient have difficulty seeing, even when wearing glasses/contacts?: No Does the patient have difficulty concentrating, remembering, or making decisions?: Yes Patient able to express need for assistance with ADLs?: Yes Does the patient have difficulty dressing or bathing?: No Independently performs ADLs?: Yes (appropriate for developmental age) Does the patient have difficulty walking or climbing stairs?: No Weakness of Legs: None Weakness of Arms/Hands: None  Home Assistive Devices/Equipment Home Assistive Devices/Equipment: None    Abuse/Neglect Assessment (Assessment to be  complete while patient is alone) Physical Abuse: Denies Verbal Abuse: Denies Sexual Abuse: Denies Exploitation of patient/patient's  resources: Denies Self-Neglect: Denies     Merchant [employer] officer (For Healthcare) Does patient have an advance directive?: No Would patient like information on creating an advanced directive?: No - patient declined information    Additional Information 1:1 In Past 12 Months?: No CIRT Risk: No Elopement Risk: No Does patient have medical clearance?: No     Disposition:  Disposition Initial Assessment Completed for this Encounter: Yes Disposition of Patient: Inpatient treatment program Type of inpatient treatment program: Adult ([facility] to [facility])  Grayling Schranz P 09/19/2016 2:05 PM

## 2016-09-19 NOTE — Progress Notes (Addendum)
Nursing close observation note D:Pt walking in the hallway going to phone ( to call her husband). RR even and unlabored. No distress noted. A: close observation continues for safety  R: pt remains safe

## 2016-09-19 NOTE — Plan of Care (Signed)
Problem: Education: Goal: Utilization of techniques to improve thought processes will improve Outcome: Progressing Nurse discussed depression/coping skills with patient.    

## 2016-09-20 ENCOUNTER — Encounter (HOSPITAL_COMMUNITY): Payer: Self-pay | Admitting: Psychiatry

## 2016-09-20 DIAGNOSIS — F3164 Bipolar disorder, current episode mixed, severe, with psychotic features: Principal | ICD-10-CM | POA: Diagnosis present

## 2016-09-20 DIAGNOSIS — Z818 Family history of other mental and behavioral disorders: Secondary | ICD-10-CM

## 2016-09-20 DIAGNOSIS — Z813 Family history of other psychoactive substance abuse and dependence: Secondary | ICD-10-CM

## 2016-09-20 MED ORDER — RISPERIDONE 2 MG PO TABS
2.0000 mg | ORAL_TABLET | Freq: Every day | ORAL | Status: DC
  Administered 2016-09-20: 2 mg via ORAL
  Filled 2016-09-20 (×2): qty 1

## 2016-09-20 MED ORDER — OLANZAPINE 5 MG PO TABS
5.0000 mg | ORAL_TABLET | Freq: Three times a day (TID) | ORAL | Status: DC | PRN
  Administered 2016-09-21 – 2016-09-24 (×4): 5 mg via ORAL
  Filled 2016-09-20 (×4): qty 2

## 2016-09-20 MED ORDER — DIPHENHYDRAMINE HCL 50 MG/ML IJ SOLN
50.0000 mg | Freq: Once | INTRAMUSCULAR | Status: AC
  Administered 2016-09-20: 50 mg via INTRAMUSCULAR
  Filled 2016-09-20 (×2): qty 1

## 2016-09-20 MED ORDER — ARIPIPRAZOLE 5 MG PO TABS
5.0000 mg | ORAL_TABLET | Freq: Every day | ORAL | Status: DC
  Filled 2016-09-20: qty 1

## 2016-09-20 MED ORDER — OLANZAPINE 10 MG IM SOLR: 5.0000 mg | Freq: Three times a day (TID) | INTRAMUSCULAR | Status: DC | PRN

## 2016-09-20 MED ORDER — LORAZEPAM 2 MG/ML IJ SOLN
2.0000 mg | Freq: Once | INTRAMUSCULAR | Status: AC
  Administered 2016-09-20: 2 mg via INTRAMUSCULAR
  Filled 2016-09-20: qty 1

## 2016-09-20 MED ORDER — DIPHENHYDRAMINE HCL 50 MG PO CAPS
50.0000 mg | ORAL_CAPSULE | Freq: Once | ORAL | Status: AC
  Filled 2016-09-20: qty 1

## 2016-09-20 MED ORDER — LORAZEPAM 1 MG PO TABS: 2.0000 mg | ORAL_TABLET | Freq: Once | ORAL | Status: AC

## 2016-09-20 MED ORDER — LORAZEPAM 2 MG/ML IJ SOLN: 1.0000 mg | Freq: Four times a day (QID) | INTRAMUSCULAR | Status: DC | PRN

## 2016-09-20 MED ORDER — BENZTROPINE MESYLATE 0.5 MG PO TABS
0.5000 mg | ORAL_TABLET | Freq: Every day | ORAL | Status: DC
  Administered 2016-09-20 – 2016-09-23 (×4): 0.5 mg via ORAL
  Filled 2016-09-20 (×6): qty 1

## 2016-09-20 MED ORDER — LORAZEPAM 1 MG PO TABS
1.0000 mg | ORAL_TABLET | Freq: Four times a day (QID) | ORAL | Status: DC | PRN
  Administered 2016-09-20 – 2016-09-30 (×11): 1 mg via ORAL
  Filled 2016-09-20 (×11): qty 1

## 2016-09-20 NOTE — Progress Notes (Signed)
Pt up in room being confrontational, pt trying not to lay down and go to sleep.

## 2016-09-20 NOTE — Progress Notes (Signed)
Pt up walking in the hall with eyes closed, trying to get out by grabbing on door. Pt redirected, but pt appears to understand what staff is saying, but chooses not to listen, pt appears to be very gamey when confronted.

## 2016-09-20 NOTE — Progress Notes (Addendum)
Close Observation: MHT continues to stay with patient for safety.  Patient attending group this afternoon.  Respirations even and unlabored.  No signs/symptoms of pain/distress noted on patient's face/body movements.  Several times this morning patient kneels in front of the heater in her room, waving her arms.   UA obtained and taken to lab for pick up today. Safety maintained with close observation per MD order.

## 2016-09-20 NOTE — Progress Notes (Signed)
Pt asked if she wanted the pills or the injection and pt responded shots.

## 2016-09-20 NOTE — [facility] Suicide Risk Assessment (Signed)
[facility] INPATIENT:  Family/Significant Other Suicide Prevention Education  Suicide Prevention Education:  Education Completed; No one has been identified by the patient as the family member/significant other with whom the patient will be residing, and identified as the person(s) who will aid the patient in the event of a mental health crisis (suicidal ideations/suicide attempt).  With written consent from the patient, the family member/significant other has been provided the following suicide prevention education, prior to the and/or following the discharge of the patient.  The suicide prevention education provided includes the following:  Suicide risk factors  Suicide prevention and interventions  National Suicide Hotline telephone number  [facility] assessment telephone number  [facility] Emergency Assistance 911  [facility] and/or Residential Mobile Crisis Unit telephone number  Request made of family/significant other to:  Remove weapons (e.g., guns, rifles, knives), all items previously/currently identified as safety concern.    Remove drugs/medications (over-the-counter, prescriptions, illicit drugs), all items previously/currently identified as a safety concern.  The family member/significant other verbalizes understanding of the suicide prevention education information provided.  The family member/significant other agrees to remove the items of safety concern listed above. The patient did not endorse SI at the time of admission, nor did the patient c/o SI during the stay here.  SPE not required.   Jessica RogueRodney B Lain Tetterton 09/20/2016, 8:31 AM

## 2016-09-20 NOTE — Tx Team (Signed)
Interdisciplinary Treatment and Diagnostic Plan Update  09/20/2016 Time of Session: 4:08 PM  Jessica Steele MRN: [id number]  Principal Diagnosis: Bipolar disorder, curr episode mixed, severe, with psychotic features ([place])  Secondary Diagnoses: Principal Problem:   Bipolar disorder, curr episode mixed, severe, with psychotic features ([place])   Current Medications:  Current Facility-Administered Medications  Medication Dose Route Frequency Provider Last Rate Last Dose  . acetaminophen (TYLENOL) tablet 650 mg  650 mg Oral Q6H PRN Ethelene Hal, NP      . alum & mag hydroxide-simeth (MAALOX/MYLANTA) 200-200-20 MG/5ML suspension 30 mL  30 mL Oral Q4H PRN Ethelene Hal, NP      . benztropine (COGENTIN) tablet 0.5 mg  0.5 mg Oral QHS Saramma Eappen, MD      . lamoTRIgine (LAMICTAL) tablet 25 mg  25 mg Oral BID Ethelene Hal, NP   25 mg at 09/20/16 0953  . LORazepam (ATIVAN) tablet 1 mg  1 mg Oral Q6H PRN Ursula Alert, MD       Or  . LORazepam (ATIVAN) injection 1 mg  1 mg Intramuscular Q6H PRN Saramma Eappen, MD      . magnesium hydroxide (MILK OF MAGNESIA) suspension 30 mL  30 mL Oral Daily PRN Ethelene Hal, NP      . OLANZapine (ZYPREXA) tablet 5 mg  5 mg Oral TID PRN Ursula Alert, MD       Or  . OLANZapine (ZYPREXA) injection 5 mg  5 mg Intramuscular TID PRN Saramma Eappen, MD      . risperiDONE (RISPERDAL) tablet 2 mg  2 mg Oral QHS Saramma Eappen, MD      . traZODone (DESYREL) tablet 50 mg  50 mg Oral QHS PRN,MR X 1 Ethelene Hal, NP   50 mg at 09/20/16 0026    PTA Medications: Prescriptions Prior to Admission  Medication Sig Dispense Refill Last Dose  . clonazePAM (KLONOPIN) 1 MG tablet Take 1 mg by mouth 2 (two) times daily as needed for anxiety.   09/18/2016 at Unknown time  . diphenhydrAMINE (BENADRYL) 25 MG tablet Take 1 tablet (25 mg total) by mouth at bedtime as needed for sleep. 30 tablet 0 Past Week at Unknown time  . risperiDONE  (RISPERDAL) 1 MG tablet Take 1 tablet (1 mg total) by mouth at bedtime. 30 tablet 2 09/18/2016 at Unknown time    Treatment Modalities: Medication Management, Group therapy, Case management,  1 to 1 session with clinician, Psychoeducation, Recreational therapy.   Physician Treatment Plan for Primary Diagnosis: Bipolar disorder, curr episode mixed, severe, with psychotic features ([place]) Long Term Goal(s): Improvement in symptoms so as ready for discharge  Short Term Goals: Ability to identify changes in lifestyle to reduce recurrence of condition will improve  Medication Management: Evaluate patient's response, side effects, and tolerance of medication regimen.  Therapeutic Interventions: 1 to 1 sessions, Unit Group sessions and Medication administration.  Evaluation of Outcomes: Not Met  Physician Treatment Plan for Secondary Diagnosis: Principal Problem:   Bipolar disorder, curr episode mixed, severe, with psychotic features ([place])   Long Term Goal(s): Improvement in symptoms so as ready for discharge  Short Term Goals: Ability to verbalize feelings will improve  Medication Management: Evaluate patient's response, side effects, and tolerance of medication regimen.  Therapeutic Interventions: 1 to 1 sessions, Unit Group sessions and Medication administration.  Evaluation of Outcomes: Not Met   RN Treatment Plan for Primary Diagnosis: Bipolar disorder, curr episode mixed, severe, with psychotic features ([place]) Long  Term Goal(s): Knowledge of disease and therapeutic regimen to maintain health will improve  Short Term Goals: Ability to verbalize frustration and anger appropriately will improve and Ability to demonstrate self-control  Medication Management: RN will administer medications as ordered by provider, will assess and evaluate patient's response and provide education to patient for prescribed medication. RN will report any adverse and/or side effects to prescribing  provider.  Therapeutic Interventions: 1 on 1 counseling sessions, Psychoeducation, Medication administration, Evaluate responses to treatment, Monitor vital signs and CBGs as ordered, Perform/monitor CIWA, COWS, AIMS and Fall Risk screenings as ordered, Perform wound care treatments as ordered.  Evaluation of Outcomes: Not Met   LCSW Treatment Plan for Primary Diagnosis: Bipolar disorder, curr episode mixed, severe, with psychotic features ([place]) Long Term Goal(s): Safe transition to appropriate next level of care at discharge, Engage patient in therapeutic group addressing interpersonal concerns.  Short Term Goals: Engage patient in aftercare planning with referrals and resources  Therapeutic Interventions: Assess for all discharge needs, 1 to 1 time with Social worker, Explore available resources and support systems, Assess for adequacy in community support network, Educate family and significant other(s) on suicide prevention, Complete Psychosocial Assessment, Interpersonal group therapy.  Evaluation of Outcomes: Not Met  Unable to engaged in meanigful conversation   Progress in Treatment: Attending groups: Yes Participating in groups: No Taking medication as prescribed: Yes Toleration medication: Yes, no side effects reported at this time Family/Significant other contact made: Yes Patient understands diagnosis: No  Limited insight Discussing patient identified problems/goals with staff: Yes Medical problems stabilized or resolved: Yes Denies suicidal/homicidal ideation: Yes Issues/concerns per patient self-inventory: None Other: N/A  New problem(s) identified: None identified at this time.   New Short Term/Long Term Goal(s): None identified at this time.   Discharge Plan or Barriers: return home, follow up outpt  Reason for Continuation of Hospitalization: Disorganization Mania Medication stabilization    Estimated Length of Stay: 3-5 days  Attendees: Patient:  09/20/2016  4:08 PM  Physician: Ursula Alert, MD 09/20/2016  4:08 PM  Nursing: Hoy Register, RN 09/20/2016  4:08 PM  RN Care Manager: Lars Pinks, RN 09/20/2016  4:08 PM  Social Worker: Ripley Fraise 09/20/2016  4:08 PM  Recreational Therapist: Laretta Bolster  09/20/2016  4:08 PM  Other: Norberto Sorenson 09/20/2016  4:08 PM  Other:  09/20/2016  4:08 PM    Scribe for Treatment Team:  Roque Lias LCSW 09/20/2016 4:08 PM

## 2016-09-20 NOTE — Progress Notes (Signed)
Close Observation: Patient's husband visited her tonight.  Patient not able to leave unit because of close observation to visit with her son in dining room tonight.  Situation explained to patient and her husband, and husband agreed that it may be in best interest of their child at this time.  MD will re-evaluate close observation daily.  Respirations even and unlabored.  No signs/symptoms of pain/distress noted on patient's face/body movements.  Patient very happy to see her husband tonight.   Close observation continues per MD order for patient's safety.

## 2016-09-20 NOTE — Progress Notes (Signed)
EKG completed and given to MD for review.  

## 2016-09-20 NOTE — [facility] Group Notes (Signed)
Close Observation: Patient has been in her room most of the morning.  Denied SI and HI, contracts for safety.  Denied A/V hallucinations.  Denied pain.  Patient has been sleeping, then gets up and walks in her room and goes to her bathroom. MHT has been with patient per MD order for safety.  Patient has been taking her medication without any problems.  Patient has been encouraged to open her eyes when she walks.  Patient denied any dizziness or lightheadedness.  Patient has stated she is looking forward to seeing her husband and son during visiting hours.  Respirations even and unlabored.  No signs/symptoms of pain/distress noted on patient's face/body movements.  Patient safety maintained with close observation.

## 2016-09-20 NOTE — [facility] Group Notes (Signed)
[facility] LCSW Group Therapy  09/20/2016 1:15 pm  Type of Therapy: Process Group Therapy  Participation Level:  Active  Participation Quality:  Appropriate  Affect:  Flat  Cognitive:  Oriented  Insight:  Improving  Engagement in Group:  Limited  Engagement in Therapy:  Limited  Modes of Intervention:  Activity, Clarification, Education, Problem-solving and Support  Summary of Progress/Problems: Today's group addressed the issue of overcoming obstacles.  Patients were asked to identify their biggest obstacle post d/c that stands in the way of their on-going success, and then problem solve as to how to manage this. Attended half of group with 1:1 staff present.  Appears disorganized, impulisve.  Unable to contribute meaningfully to discussion, though she tried, and got up and down several times to try to take my dry erase marker and check for coffee, which was empty.  [name], Latondra Gebhart B 09/20/2016   3:45 PM

## 2016-09-20 NOTE — Progress Notes (Signed)
Nursing close observation note D:Pt observed in hallway trying to call her husband. No distress noted.Pt continues to be intrusive.   A:  close observation continues for safety  R: pt remains safe

## 2016-09-20 NOTE — [facility] Suicide Risk Assessment (Signed)
[facility]   Nursing information obtained from:  Patient Demographic factors:  Unemployed Current Mental Status:    Loss Factors:  Financial problems / change in socioeconomic status Historical Factors:    Risk Reduction Factors:  Responsible for children under 33 years of age, Sense of responsibility to family, Religious beliefs about death, Living with another person, especially a relative, Positive social support  Total Time spent with patient: 30 minutes Principal Problem: Bipolar disorder, curr episode mixed, severe, with psychotic features (HCC) Diagnosis:   Patient Active Problem List   Diagnosis Date Noted  . Bipolar disorder, curr episode mixed, severe, with psychotic features (HCC) [F31.64] 09/20/2016   Subjective Data: Please see H&P.   Continued Clinical Symptoms:  Alcohol Use Disorder Identification Test Final Score (AUDIT): 0 The "Alcohol Use Disorders Identification Test", Guidelines for Use in Primary Care, Second Edition.  World Science writerHealth Organization [facility](WHO). Score between 0-7:  no or low risk or alcohol related problems. Score between 8-15:  moderate risk of alcohol related problems. Score between 16-19:  high risk of alcohol related problems. Score 20 or above:  warrants further diagnostic evaluation for alcohol dependence and treatment.   CLINICAL FACTORS:   Panic Attacks Depression:   Delusional Hopelessness Impulsivity Insomnia Currently Psychotic Unstable or Poor Therapeutic Relationship Previous Psychiatric Diagnoses and Treatments   Musculoskeletal: Strength & Muscle Tone: within normal limits Gait & Station: normal Patient leans: N/A  Psychiatric Specialty Exam: Physical Exam  Review of Systems  Psychiatric/Behavioral: Positive for depression and hallucinations. The patient is nervous/anxious and has insomnia.   All other systems reviewed and are negative.   Blood pressure 114/72, pulse (!) 115, temperature 98.8 F  (37.1 C), temperature source Oral, resp. rate 20, height 5\' 2"  (1.575 m), weight 75.8 kg (167 lb), last menstrual period 11/02/2011, SpO2 100 %.Body mass index is 30.54 kg/m.          Please see H&P.                                                 COGNITIVE FEATURES THAT CONTRIBUTE TO RISK:  Closed-mindedness, Polarized thinking and Thought constriction (tunnel vision)    SUICIDE RISK:   Mild:  Suicidal ideation of limited frequency, intensity, duration, and specificity.  There are no identifiable plans, no associated intent, mild dysphoria and related symptoms, good self-control (both objective and subjective assessment), few other risk factors, and identifiable protective factors, including available and accessible social support.   PLAN OF CARE: Please see H&P.   I certify that inpatient services furnished can reasonably be expected to improve the patient's condition.  Rosalea Withrow, MD 09/20/2016, 11:37 AM

## 2016-09-20 NOTE — Progress Notes (Signed)
Close observation: Patient has been in her room this morning with MHT for close observation.  Patient ate approximately 25% of breakfast, was given ginger ale, gatorade and cup of water.  Patient took her latuda with no problem.  Patient denied SI and HI.  Denied A/V hallucinations.  Denied pain.  Patient wants to see her husband and son.  Husband visited patient last night.  Patient wants husband to bring in her makeup.  Respirations even and unlabored.  No signs/symptoms of pain/distress noted on patient's face/body movement.  Close observation continues for patient's safety per MD order.

## 2016-09-20 NOTE — Progress Notes (Signed)
Close observation: Patient has been in her room and also walking in hallway this afternoon.  Patient has been to the bathroom several times.  Also patient has been laying in her bed resting intermittently this afternoon. Patient looking forward to her husband visiting tonight.  Patient denied SI and HI, contracts for safety.  Denied A/V hallucinations.  Respirations even and unlabored.  No signs/symptoms of pain/distress noted on patient's face/body movements. Close observation continues for patient's safety per MD orders.

## 2016-09-20 NOTE — Progress Notes (Signed)
Pt did not attend wrap-up group this evening.  Tomi BambergerMariya Teran Knittle, MHT

## 2016-09-20 NOTE — Progress Notes (Signed)
Close observation: Patient has been in her room most of the afternoon.  Patient has walked down hallway, held out her arms, closed her eyes for a few minutes at a time.  Patient has been to the bathroom several times today.  Has washed hands/arms in bathroom sink.  Respirations even and unlabored.  No signs/symptoms of pain/distress noted on patient's face/body movements.  Close observation continues for patient's safety.

## 2016-09-20 NOTE — Progress Notes (Signed)
Psychoeducational Group Note  Date:  09/20/2016 Time:  2142  Group Topic/Focus:  Wrap-Up Group:   The focus of this group is to help patients review their daily goal of treatment and discuss progress on daily workbooks.   Participation Level: Did Not Attend  Participation Quality:  Not Applicable  Affect:  Not Applicable  Cognitive:  Not Applicable  Insight:  Not Applicable  Engagement in Group: Not Applicable  Additional Comments:  The patient did not attend group this evening since she remained in her room.    Hazle CocaGOODMAN, Edwina Grossberg S 09/20/2016, 9:42 PM

## 2016-09-20 NOTE — Progress Notes (Addendum)
Nursing close observation note D:Pt observed sleeping in bed with eyes closed. RR even and unlabored. No distress noted. A: close observation continues for safety  R: pt remains safe

## 2016-09-20 NOTE — H&P (Addendum)
Psychiatric Admission Assessment Adult  Patient Identification: Jessica Steele MRN:  [id number] Date of Evaluation:  09/20/2016 Chief Complaint: Patient states " I am just trying to get help. "  Principal Diagnosis: Bipolar disorder, curr episode mixed, severe, with psychotic features [facility]) Diagnosis:   Patient Active Problem List   Diagnosis Date Noted  . Bipolar disorder, curr episode mixed, severe, with psychotic features [facility]) [F31.64] 09/20/2016     History of Present Illness: Jessica Steele is a 35 y old [place] female , married , unemployed , lives with husband and 9 year old son in [place] , who has a hx of Bipolar disorder , presented to [facility] as a walk in for worsening mood sx, sleep issues and psychosis.   Per initial notes in EHR : ' Pt presents voluntarily to [facility] for assessment accompanied by her husband, Bhanu. Pt's affect is labile and restless. She is dressed appropriately for the weather. She cries at some points, and at other times she smiles and giggles. Sometimes pt closes eyes and rests head on husband's shoulder. Pt's speech is circumstantial and tangential. More than once, pt says, "Be true to yourself. Listen to your conscience." Per chart review, Pt sees Dr Landis Gandy at [facility] outpatient clinic. Pt came in to [facility] assessment clinic yesterday. On 09/16/16 Dr Ladona Ridgel called in risperidone 1 mg for pt.  Apparently, pt also presented to [facility] last night for insomnia.  Collateral info provided by husband, Bhanu. He reports pt has barely slept in approx. 4 days. Per chart review, pt was admitted inpatient to [facility] for 10 days in 2012. He sts pt hasn't been on any psych meds since April 2017 when she stopped taking meds b/c of side effects. He reports pt visited [place] for 40 days and returned this july. Husband reports lately pt has been screaming at their 56 yo son and at him. He says she is slamming doors and walking in and outside several times an hour. Husband reports pt put  cotton in her ears last night b/c she was "hearing voices". He says that pt thought characters on a tv show last night were talking about her."   Patient seen and chart reviewed today .Discussed patient with treatment team. Patient today is seen as intrusive , restless often , labile , inappropriate , she sat through the entire evaluation with her eyes closed. Patient is disorganized and tangential , making irrelevant statements on and off going off topic often, has loose thought process. Pt reports she has been having some increased stress recently due to her staying at home , them buying a house and from her mother in law with whom she has conflicts.Patient being disorganized is a poor historian at this time and hence contacted husband to obtain collateral information with patient's consent.  Collateral information was obtained from husband Mr.Jessica Steele - patient sat with her eyes closed during the phone call and later on was seen as being intrusive , unable to sit still , Primary school teacher for water multiple times when Clinical research associate was speaking to husband and needed redirection several times. Per husband - patient was kind of stressed out and anxious about them buying a house - husband gave the sellers a check on Wednesday - patient became more and more argumentative that day , and stopped sleeping , was noted as making irrelevant statements . Hence patient was taken to the out patient clinic where Dr.Taylor saw her and started her back on risperidone . But inspite of that  she did not seems to get better . Per him patient was talking about shows on TV talking about her and directed towards her and she appeared to be delusional . As per husband patient had her first admission in 2012 - when she started having some agitation , sleep issues- was admitted at [facility] for 20 days or so .Patient was initially following up with Dr,Ahavulaia - was on depakote , effexor and klonopin , but depakote was stopped  sometime in 2014. Klonopin was stopped sometime in 2015. Per husband klonopin is an addcitiev medication and that is another reason for stopping it and he wonders whether she really needs it.She was in [place] recently in June for 40 days and at that time was not compliant with any of her medications - per husband all medications were stopped.Patient usually presents as very energetic , doing a lot of things at home , wants everything to be perfect and that according to him is her drawback. Per husband , she was an angry person to begin with , and continues to have anger issues often . 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 When patient got back she had a lot of pressure from work and felt like she could not manage it and eventually lost her job.   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 She was doing so well recently - her medications were being tapered down.  Patient has always done well on Risperidone - a small dose - hence would suggest she be started back on risperidone 2 mg and see how she progresses on that. She has also talked about how risperidone has helped her to sleep in the past .   Associated Signs/Symptoms: Depression Symptoms:  insomnia, difficulty concentrating, (Hypo) Manic Symptoms:  Delusions, Distractibility, Elevated Mood, Flight of Ideas, Hallucinations, Impulsivity, Anxiety Symptoms:  vague anxiety Psychotic Symptoms:  Delusions, Hallucinations: Auditory- says "shhhh"  Visual- images in mirror  Ideas of Reference- spoke about TV shows being directed towards her to husband prior to this admission Paranoia, PTSD  Symptoms: Negative Total Time spent with patient: 1 hour  Past Psychiatric History: Patient was initially diagnosed with Brief psychotic disorder, then with Bipolar disorder , currently follow up with Dr.Agarwal at [facility] out patient [facility] . Patient denies any suicide attempts . Pt has been noncompliant with medications.  Is the patient at risk to self? Yes.    Has the patient been a risk to self in the past 6 months? No.  Has the patient been a risk to self within the distant past? No.  Is the patient a risk to others? Yes.    Has the patient been a risk to others in the past 6 months? No.  Has the patient been a risk to others within the distant past? No.   Prior Inpatient Therapy:   Prior Outpatient Therapy:    Alcohol Screening: 1. How often do you have a drink containing alcohol?: Never 9. Have you or someone else been injured as a result of your drinking?: No 10. Has a relative or friend or a doctor or another health worker been concerned about  your drinking or suggested you cut down?: No Alcohol Use Disorder Identification Test Final Score (AUDIT): 0 Brief Intervention: AUDIT score less than 7 or less-screening does not suggest unhealthy drinking-brief intervention not indicated Substance Abuse History in the last 12 months:  No. Consequences of Substance Abuse: Negative Previous Psychotropic Medications: Yes - effexor , depakote , klonopin Psychological Evaluations: No  Past Medical History:  Past Medical History:  Diagnosis Date  . Anxiety   . Bipolar disorder (HCC)   . Depression   . Fast heart beat    associated with anxiety  . PCOS (polycystic ovarian syndrome)   . Schizophrenia [facility](HCC)     Past Surgical History:  Procedure Laterality Date  . CESAREAN SECTION     Family History:  Family History  Problem Relation Age of Onset  . Alcohol abuse Neg Hx   . Anxiety disorder Neg Hx   . Bipolar disorder Neg Hx   . Drug abuse Neg Hx   . Depression Neg Hx   .  Schizophrenia Neg Hx   . Suicidality Neg Hx    Family Psychiatric  History: Denies hx of mental illness, suicide or alcoholism drug abuse in family. Tobacco Screening: Have you used any form of tobacco in the last 30 days? 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 Patient lost her job due to being on medications and being unable to concentrate at work. Pt has a 696 yr old son who she takes care of and per husband she is a very supportive, caring mother. Husband is supportive , they do have occasional relational stressors , her not getting along with the in- laws and so on , but overall have a good relationship with her husband. History  Alcohol Use No     History  Drug Use No    Additional Social History: Marital status: Married Number of Years Married: 9 What types of issues is patient dealing with in the relationship?: Denies having any problems with her husband Are you sexually active?: Yes What is your sexual orientation?: Straight Does patient have children?: Yes How many children?: 1 How is patient's relationship with their children?: 466-1/80 years old son    Pain Medications: denied Prescriptions: risperdal   klonipin Over the Counter: tylenol History of alcohol / drug use?: No history of alcohol / drug abuse Longest period of sobriety (when/how long): never used alcohol, drugs, tobacco Negative Consequences of Use: Financial Withdrawal Symptoms: Other (Comment) (denied withdrawals)                    Allergies:  Not on File Lab Results: No results found for this or any previous visit (from the past 48 hour(s)).  Blood Alcohol level:  Lab Results  Component Value Date   [facility]10[facility]    Metabolic  Disorder Labs:  No results found for: HGBA1C, MPG No results found for: PROLACTIN No results found for: CHOL, TRIG, HDL, CHOLHDL, VLDL, LDLCALC  Current Medications: Current Facility-Administered Medications  Medication Dose Route Frequency Provider Last Rate Last Dose  . acetaminophen (TYLENOL) tablet 650 mg  650 mg Oral Q6H PRN Laveda AbbeLaurie Britton Parks, NP      . alum & mag hydroxide-simeth (MAALOX/MYLANTA)  200-200-20 MG/5ML suspension 30 mL  30 mL Oral Q4H PRN Laveda Abbe, NP      . benztropine (COGENTIN) tablet 0.5 mg  0.5 mg Oral QHS Jantzen Pilger, MD      . lamoTRIgine (LAMICTAL) tablet 25 mg  25 mg Oral BID Laveda Abbe, NP   25 mg at 09/20/16 0953  . LORazepam (ATIVAN) tablet 1 mg  1 mg Oral Q6H PRN Jomarie Longs, MD       Or  . LORazepam (ATIVAN) injection 1 mg  1 mg Intramuscular Q6H PRN Leul Narramore, MD      . magnesium hydroxide (MILK OF MAGNESIA) suspension 30 mL  30 mL Oral Daily PRN Laveda Abbe, NP      . OLANZapine (ZYPREXA) tablet 5 mg  5 mg Oral TID PRN Jomarie Longs, MD       Or  . OLANZapine (ZYPREXA) injection 5 mg  5 mg Intramuscular TID PRN Kingstin Heims, MD      . risperiDONE (RISPERDAL) tablet 2 mg  2 mg Oral QHS Chantry Headen, MD      . traZODone (DESYREL) tablet 50 mg  50 mg Oral QHS PRN,MR X 1 Laveda Abbe, NP   50 mg at 09/20/16 0026   PTA Medications: Prescriptions Prior to Admission  Medication Sig Dispense Refill Last Dose  . clonazePAM (KLONOPIN) 1 MG tablet Take 1 mg by mouth 2 (two) times daily as needed for anxiety.   09/18/2016 at Unknown time  . diphenhydrAMINE (BENADRYL) 25 MG tablet Take 1 tablet (25 mg total) by mouth at bedtime as needed for sleep. 30 tablet 0 Past Week at Unknown time  . risperiDONE (RISPERDAL) 1 MG tablet Take 1 tablet (1 mg total) by mouth at bedtime. 30 tablet 2 09/18/2016 at Unknown time    Musculoskeletal: Strength & Muscle Tone: within normal limits Gait & Station: normal Patient  leans: N/A  Psychiatric Specialty Exam: Physical Exam  Nursing note and vitals reviewed. Constitutional:  I concur with PE done in EHR     Review of Systems  Psychiatric/Behavioral: Positive for hallucinations. The patient is nervous/anxious and has insomnia.   All other systems reviewed and are negative.   Blood pressure 119/75, pulse 92, temperature 98.8 F (37.1 C), temperature source Oral, resp. rate 20, height 5\' 2"  (1.575 m), weight 75.8 kg (167 lb), last menstrual period 11/02/2011, SpO2 100 %.Body mass index is 30.54 kg/m.  General Appearance: Guarded  Eye Contact:  Minimal  Speech:  Normal Rate  Volume:  Normal  Mood:  Anxious  Affect:  Inappropriate  Thought Process:  Disorganized, Irrelevant and Descriptions of Associations: Loose  Orientation:  Other:  states the time , is oreinted to self  Thought Content:  Delusions, Hallucinations: Auditory Visual, Ideas of Reference:   Paranoia Delusions, Paranoid Ideation, Rumination and Tangential  Suicidal Thoughts:  No patient is delusional , paranoid , intrusive and is a potential danger to self or others.  Homicidal Thoughts:  No  Memory:  Immediate;   Fair Recent;   Fair Remote;   Poor  Judgement:  Impaired  Insight:  Shallow  Psychomotor Activity:  Restlessness  Concentration:  Concentration: Poor and Attention Span: Poor  Recall:  [employer] of Knowledge:  Fair  Language:  Fair  Akathisia:  No  Handed:  Right  AIMS (if indicated):     Assets:  Desire for Improvement Housing Intimacy Social Support Talents/Skills Transportation Vocational/Educational  ADL's:  Intact  Cognition:  WNL  Sleep:  Number of Hours: 3    Treatment Plan Summary:Jessica Steele is a 18 y old [place] female , married , unemployed , lives with husband and 70 year old son in [place] , who has a hx of Bipolar disorder , presented to [facility] as a walk in for worsening mood sx, sleep issues and psychosis. Patient is delusional , disorganized ,  is currently on CO for safety while on the unit . Will restart medication and continue treatment.  Daily contact with patient to assess and evaluate symptoms and progress in treatment and Medication management Patient will benefit from inpatient treatment and stabilization.  Estimated length of stay is 5-7 days.  Reviewed past medical records,treatment plan.  Will restart Risperidone 2 mg po qhs for mood sx, psychosis.  Will add Cogentin 0.5 mg po qhs  for EPS. Will discontinue Klonopin. Will add Ativan 1 mg po q6h prn for severe anxiety/agitation. Will continue Lamictal 25 mg po bid for mood lability. Will continue Trazodone 50 mg po qhs for sleep. Will make available PRN medications as per agitation protocol. Will continue to monitor vitals ,medication compliance and treatment side effects while patient is here.  Will monitor for medical issues as well as call consult as needed.  Ordered labs as above- will get EKG for qtc. Was able to obtain collateral information from Dr.Agarwal as well as husband - pls see above. CSW will start working on disposition.  Patient to participate in therapeutic milieu .      Observation Level/Precautions:  closed observation   Laboratory:  will get CBC, TSH, LIPID PANEL, HBA1C, PL, UA, Urine pregnancy test  Psychotherapy:  Individual and group therapy     Consultations:  CSW  Discharge Concerns:  Stability and safety       Physician Treatment Plan for Primary Diagnosis: Bipolar disorder, curr episode mixed, severe, with psychotic features (HCC) Long Term Goal(s): Improvement in symptoms so as ready for discharge  Short Term Goals: Ability to identify changes in lifestyle to reduce recurrence of condition will improve and Ability to verbalize feelings will improve  Physician Treatment Plan for Secondary Diagnosis: Principal Problem:   Bipolar disorder, curr episode mixed, severe, with psychotic features (HCC)  Long Term Goal(s): Improvement in  symptoms so as ready for discharge  Short Term Goals: Ability to identify changes in lifestyle to reduce recurrence of condition will improve and Ability to verbalize feelings will improve  I certify that inpatient services furnished can reasonably be expected to improve the patient's condition.    Kastiel Simonian, MD 10/16/201712:25 PM

## 2016-09-20 NOTE — Plan of Care (Signed)
Problem: Coping: Goal: Ability to verbalize frustrations and anger appropriately will improve Outcome: Not Progressing Pt continues to present with resistant nature, but pt will cooperate when coaxed.

## 2016-09-20 NOTE — Plan of Care (Signed)
Problem: Education: Goal: Knowledge of the prescribed therapeutic regimen will improve Outcome: Progressing Nurse discussed depression/coping skills with patient.        

## 2016-09-21 LAB — URINALYSIS W MICROSCOPIC (NOT AT ARMC)
BILIRUBIN URINE: NEGATIVE
Glucose, UA: NEGATIVE mg/dL
HGB URINE DIPSTICK: NEGATIVE
KETONES UR: NEGATIVE mg/dL
LEUKOCYTES UA: NEGATIVE
NITRITE: NEGATIVE
PROTEIN: NEGATIVE mg/dL
Specific Gravity, Urine: 1.004 — ABNORMAL LOW (ref 1.005–1.030)
pH: 6.5 (ref 5.0–8.0)

## 2016-09-21 LAB — CBC WITH DIFFERENTIAL/PLATELET
BASOS PCT: 0 %
Basophils Absolute: 0.1 10*3/uL (ref 0.0–0.1)
Eosinophils Absolute: 0.4 10*3/uL (ref 0.0–0.7)
Eosinophils Relative: 3 %
HEMATOCRIT: 39 % (ref 36.0–46.0)
Hemoglobin: 12.8 g/dL (ref 12.0–15.0)
Lymphocytes Relative: 31 %
Lymphs Abs: 4 10*3/uL (ref 0.7–4.0)
MCH: 27.6 pg (ref 26.0–34.0)
MCHC: 32.8 g/dL (ref 30.0–36.0)
MCV: 84.2 fL (ref 78.0–100.0)
MONO ABS: 0.9 10*3/uL (ref 0.1–1.0)
MONOS PCT: 7 %
NEUTROS ABS: 7.7 10*3/uL (ref 1.7–7.7)
Neutrophils Relative %: 59 %
Platelets: 361 10*3/uL (ref 150–400)
RBC: 4.63 MIL/uL (ref 3.87–5.11)
RDW: 13.6 % (ref 11.5–15.5)
WBC: 13 10*3/uL — ABNORMAL HIGH (ref 4.0–10.5)

## 2016-09-21 LAB — LIPID PANEL
Cholesterol: 194 mg/dL (ref 0–200)
HDL: 37 mg/dL — ABNORMAL LOW (ref >40)
LDL CALC: 126 mg/dL — AB (ref 0–99)
TRIGLYCERIDES: 156 mg/dL — AB (ref <150)
Total CHOL/HDL Ratio: 5.2 RATIO
VLDL: 31 mg/dL (ref 0–40)

## 2016-09-21 LAB — PREGNANCY, URINE: PREG TEST UR: NEGATIVE

## 2016-09-21 LAB — TSH: TSH: 0.597 u[IU]/mL (ref 0.350–4.500)

## 2016-09-21 MED ORDER — LIDOCAINE 5 % EX PTCH
1.0000 | MEDICATED_PATCH | CUTANEOUS | Status: DC
  Administered 2016-09-22 – 2016-10-01 (×9): 1 via TRANSDERMAL
  Filled 2016-09-21 (×12): qty 1

## 2016-09-21 MED ORDER — RISPERIDONE 2 MG PO TABS
2.5000 mg | ORAL_TABLET | Freq: Every day | ORAL | Status: DC
  Administered 2016-09-21 – 2016-09-22 (×2): 2.5 mg via ORAL
  Filled 2016-09-21 (×3): qty 1

## 2016-09-21 NOTE — Progress Notes (Signed)
Patient being monitore 1:1 with staff in close proximity.  Patient stated that she feels like she is killing someone when asked who patient said myself.  Patient denies AVH but states sometimes there is a voice that she doesn't know where its coming from.   Assess patient for safety, offer medications as prescribed engage patient in 1:1 staff talks,   Continue to monitor as prescribed.

## 2016-09-21 NOTE — Progress Notes (Signed)
Recreation Therapy Notes  09/21/16 1320:  LRT went to do pt assessment.  Pt was laughing during the questions and was putting her hands over face.  Pt was unable answer and focus on the questions.   Caroll RancherMarjette Fallon Haecker, LRT/CTRS    Lillia AbedLindsay, Zakery Normington A 09/21/2016 3:17 PM

## 2016-09-21 NOTE — Progress Notes (Signed)
Pt up hollering out at times. Pt up trying to walk down the hall and get out the door of 500 hall. Pt had to be redirected back to her room.

## 2016-09-21 NOTE — Progress Notes (Signed)
Nursing close observation note D:Pt observed sitting in bed with eyes open. RR even and unlabored. No distress noted. A: close observation continues for safety  R: pt remains safe

## 2016-09-21 NOTE — [facility] Group Notes (Signed)
[facility] LCSW Group Therapy  09/21/2016 12:00 PM   Type of Therapy:  Group Therapy  Participation Level:  Active  Participation Quality:  Attentive  Affect:  Appropriate  Cognitive:  Appropriate  Insight:  Improving  Engagement in Therapy:  Engaged  Modes of Intervention:  Clarification, Education, Exploration and Socialization  Summary of Progress/Problems: Today's group focused on resilience.  Made a brief appearance for the last 10 minutes of group.  Talked about how she gets angry, but did not identify it as a problem.  Shared a story from school about her father picking her up and making a joke of not recognizing her due to everyone wearing uniforms.  Affect is euphoric, but was less distracted and able to sit today.  Was also more organized in her thoughts and presentation.  [name], Jovonta Levit B 09/21/2016 , 12:00 PM

## 2016-09-21 NOTE — Progress Notes (Signed)
Patient being monitored 1:1.  Staff within close proximity of patient.  Patient in no imminent danger.  

## 2016-09-21 NOTE — Progress Notes (Signed)
Nursing Close observation note D:Pt observed sleeping in bed with eyes closed. RR even and unlabored. No distress noted. A: Close observation continues for safety  R: pt remains safe  

## 2016-09-21 NOTE — Progress Notes (Signed)
[facility] MD Progress Note  09/21/2016 2:00 PM Jessica Steele  MRN:  [id number] Subjective:  Patient states " I am fine.'  Objective:Jessica Steele is a 35 y old [place] female , married , unemployed , lives with husband and 33 year old son in [place] , who has a hx of Bipolar disorder , presented to [facility] as a walk in for worsening mood sx, sleep issues and psychosis.   Patient seen and chart reviewed.Discussed patient with treatment team.  Patient today is seen as intrusive , restless often , however improved since yesterday. Pt often seen with eyes closed and ?praying . Pt denies any AH today , however appears disorganized often. Pt continues to be tangential with loose thought process. Pt continues to be on 1:1 precaution for being intrusive and disorganized.    Principal Problem: Bipolar disorder, curr episode mixed, severe, with psychotic features (HCC) Diagnosis:   Patient Active Problem List   Diagnosis Date Noted  . Bipolar disorder, curr episode mixed, severe, with psychotic features (HCC) [F31.64] 09/20/2016   Total Time spent with patient: 30 minutes  Past Psychiatric History: Please see H&P.   Past Medical History:  Past Medical History:  Diagnosis Date  . Anxiety   . Bipolar disorder (HCC)   . Depression   . Fast heart beat    associated with anxiety  . PCOS (polycystic ovarian syndrome)   . Schizophrenia [facility])     Past Surgical History:  Procedure Laterality Date  . CESAREAN SECTION     Family History:  Family History  Problem Relation Age of Onset  . Alcohol abuse Neg Hx   . Anxiety disorder Neg Hx   . Bipolar disorder Neg Hx   . Drug abuse Neg Hx   . Depression Neg Hx   . Schizophrenia Neg Hx   . Suicidality Neg Hx    Family Psychiatric  History: Please see H&P.  Social History: Please see H&P.  History  Alcohol Use No     History  Drug Use No    Social History   Social History  . Marital status: Married    Spouse name: N/A  . Number of  children: 1  . Years of education: 79   Social History Main Topics  . Smoking status: Never Smoker  . Smokeless tobacco: Never Used     Comment: non smoker  . Alcohol use No  . Drug use: No  . Sexual activity: Yes    Birth control/ protection: None   Other Topics Concern  . None   Social History Narrative   Married and living with husband and 5yo son. 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 Grew up in [place]. Pt has a BS in engineering and denies problems in school.    Additional Social History:    Pain Medications: denied Prescriptions: risperdal   klonipin Over the Counter: tylenol History of alcohol / drug use?: No history of alcohol / drug abuse Longest period of sobriety (when/how long): never used alcohol, drugs, tobacco Negative Consequences of Use: Financial Withdrawal Symptoms: Other (Comment) (denied withdrawals)                    Sleep: improving  Appetite:  Fair  Current Medications: Current Facility-Administered Medications  Medication Dose Route Frequency Provider Last Rate Last Dose  . acetaminophen (TYLENOL) tablet 650 mg  650 mg Oral Q6H PRN Jessica Abbe, NP      .  alum & mag hydroxide-simeth (MAALOX/MYLANTA) 200-200-20 MG/5ML suspension 30 mL  30 mL Oral Q4H PRN Jessica Abbe, NP      . benztropine (COGENTIN) tablet 0.5 mg  0.5 mg Oral QHS Jessica Longs, MD   0.5 mg at 09/20/16 2137  . lamoTRIgine (LAMICTAL) tablet 25 mg  25 mg Oral BID Jessica Abbe, NP   25 mg at 09/20/16 2137  . lidocaine (LIDODERM) 5 % 1 patch  1 patch Transdermal Q24H Jessica Servellon, MD      . LORazepam (ATIVAN) tablet 1 mg  1 mg Oral Q6H PRN Jessica Longs, MD   1 mg at 09/20/16 2137   Or  . LORazepam (ATIVAN) injection 1 mg  1 mg Intramuscular Q6H PRN Jessica Camba, MD      . magnesium hydroxide (MILK OF MAGNESIA) suspension 30 mL  30 mL Oral Daily PRN Jessica Abbe, NP      . OLANZapine [facility]) tablet 5 mg  5 mg Oral  TID PRN Jessica Longs, MD   5 mg at 09/21/16 0111   Or  . OLANZapine (ZYPREXA) injection 5 mg  5 mg Intramuscular TID PRN Jessica Longs, MD      . risperiDONE (RISPERDAL) tablet 2.5 mg  2.5 mg Oral QHS Jessica Mcloughlin, MD      . traZODone (DESYREL) tablet 50 mg  50 mg Oral QHS PRN,MR X 1 Jessica Abbe, NP   50 mg at 09/21/16 0111    Lab Results:  Results for orders placed or performed during the hospital encounter of 09/19/16 (from the past 48 hour(s))  Urinalysis with microscopic (not at [facility])     Status: Abnormal   Collection Time: 09/20/16  1:00 PM  Result Value Ref Range   Color, Urine YELLOW YELLOW   APPearance CLEAR CLEAR   Specific Gravity, Urine 1.004 (L) 1.005 - 1.030   pH 6.5 5.0 - 8.0   Glucose, UA NEGATIVE NEGATIVE mg/dL   Hgb urine dipstick NEGATIVE NEGATIVE   Bilirubin Urine NEGATIVE NEGATIVE   Ketones, ur NEGATIVE NEGATIVE mg/dL   Protein, ur NEGATIVE NEGATIVE mg/dL   Nitrite NEGATIVE NEGATIVE   Leukocytes, UA NEGATIVE NEGATIVE   WBC, UA 0-5 0 - 5 WBC/hpf   RBC / HPF 0-5 0 - 5 RBC/hpf   Bacteria, UA RARE (A) NONE SEEN   Squamous Epithelial / LPF 0-5 (A) NONE SEEN    Comment: Performed at [facility]  Pregnancy, urine     Status: None   Collection Time: 09/20/16  1:00 PM  Result Value Ref Range   Preg Test, Ur NEGATIVE NEGATIVE    Comment:        THE SENSITIVITY OF THIS METHODOLOGY IS >20 mIU/mL. Performed at [facility]   TSH     Status: None   Collection Time: 09/21/16  7:11 AM  Result Value Ref Range   TSH 0.597 0.350 - 4.500 uIU/mL    Comment: Performed by a 3rd Generation assay with a functional sensitivity of <=0.01 uIU/mL. Performed at [facility]   CBC with Differential/Platelet     Status: Abnormal   Collection Time: 09/21/16  7:11 AM  Result Value Ref Range   WBC 13.0 (H) 4.0 - 10.5 K/uL   RBC 4.63 3.87 - 5.11 MIL/uL   Hemoglobin 12.8 12.0 - 15.0 g/dL   HCT 16.1 09.6 - 04.5 %    MCV 84.2 78.0 - 100.0 fL   MCH 27.6 26.0 - 34.0  pg   MCHC 32.8 30.0 - 36.0 g/dL   RDW 16.113.6 09.611.5 - 04.515.5 %   Platelets 361 150 - 400 K/uL   Neutrophils Relative % 59 %   Neutro Abs 7.7 1.7 - 7.7 K/uL   Lymphocytes Relative 31 %   Lymphs Abs 4.0 0.7 - 4.0 K/uL   Monocytes Relative 7 %   Monocytes Absolute 0.9 0.1 - 1.0 K/uL   Eosinophils Relative 3 %   Eosinophils Absolute 0.4 0.0 - 0.7 K/uL   Basophils Relative 0 %   Basophils Absolute 0.1 0.0 - 0.1 K/uL    Comment: Performed at [facility]  Lipid panel     Status: Abnormal   Collection Time: 09/21/16  7:11 AM  Result Value Ref Range   Cholesterol 194 0 - 200 mg/dL   Triglycerides 409156 (H) <150 mg/dL   HDL 37 (L) >81>40 mg/dL   Total CHOL/HDL Ratio 5.2 RATIO   VLDL 31 0 - 40 mg/dL   LDL Cholesterol 191126 (H) 0 - 99 mg/dL    Comment:        Total Cholesterol/HDL:CHD Risk Coronary Heart Disease Risk Table                     Men   Women  1/2 Average Risk   3.4   3.3  Average Risk       5.0   4.4  2 X Average Risk   9.6   7.1  3 X Average Risk  23.4   11.0        Use the calculated Patient Ratio above and the CHD Risk Table to determine the patient's CHD Risk.        ATP III CLASSIFICATION (LDL):  <100     mg/dL   Optimal  [id number]  mg/dL   Near or Above                    Optimal  130-159  mg/dL   Borderline  [id number]  mg/dL   High  >657>190     mg/dL   Very High [facility]     Blood Alcohol level:  Lab Results  Component Value Date   [facility]    Metabolic Disorder Labs: No results found for: HGBA1C, MPG No results found for: PROLACTIN Lab Results  Component Value Date   CHOL 194 09/21/2016   TRIG 156 (H) 09/21/2016   HDL 37 (L) 09/21/2016   CHOLHDL 5.2 09/21/2016   VLDL 31 09/21/2016   LDLCALC 126 (H) 09/21/2016    Physical Findings: AIMS: Facial and Oral Movements Muscles of Facial Expression: None, normal Lips and Perioral Area: None, normal Jaw: None,  normal Tongue: None, normal,Extremity Movements Upper (arms, wrists, hands, fingers): None, normal Lower (legs, knees, ankles, toes): None, normal, Trunk Movements Neck, shoulders, hips: None, normal, Overall Severity Severity of abnormal movements (highest score from questions above): None, normal Incapacitation due to abnormal movements: None, normal Patient's awareness of abnormal movements (rate only patient's report): No Awareness, Dental Status Current problems with teeth and/or dentures?: No Does patient usually wear dentures?: No  CIWA:  CIWA-Ar Total: 1 COWS:  COWS Total Score: 2  Musculoskeletal: Strength & Muscle Tone: within normal limits Gait & Station: normal Patient leans: N/A  Psychiatric Specialty Exam: Physical Exam  Nursing note and vitals reviewed.   Review of Systems  Musculoskeletal: Positive for back pain.  Psychiatric/Behavioral: The patient is nervous/anxious  and has insomnia.   All other systems reviewed and are negative.   Blood pressure 113/72, pulse (!) 113, temperature 98.3 F (36.8 C), temperature source Oral, resp. rate 17, height 5\' 2"  (1.575 m), weight 75.8 kg (167 lb), last menstrual period 11/02/2011, SpO2 100 %.Body mass index is 30.54 kg/m.  General Appearance: Guarded  Eye Contact:  Poor  Speech:  Slow  Volume:  Normal  Mood:  Anxious  Affect:  Congruent  Thought Process:  Disorganized, Irrelevant and Descriptions of Associations: Loose  Orientation:  Full (Time, Place, and Person)  Thought Content:  Delusions and Tangential  Suicidal Thoughts:  No  Homicidal Thoughts:  No  Memory:  Immediate;   Fair Recent;   Poor Remote;   Poor  Judgement:  Impaired  Insight:  Shallow  Psychomotor Activity:  Restlessness  Concentration:  Concentration: Fair and Attention Span: Poor  Recall:  [employer] of Knowledge:  Fair  Language:  Fair  Akathisia:  No  Handed:  Right  AIMS (if indicated):     Assets:  Desire for Improvement  ADL's:   Intact  Cognition:  WNL  Sleep:  Number of Hours: 4.25     Treatment Plan Summary:Jessica Steele is a 29 y old [place] female , married , unemployed , lives with husband and 4 year old son in [place] , who has a hx of Bipolar disorder , presented to [facility] as a walk in for worsening mood sx, sleep issues and psychosis.   Patient today is seen as intrusive , disorganized , although progressing.Will continue 1:1 precaution for patient since she continues to be disorganized, needs redirection.  Daily contact with patient to assess and evaluate symptoms and progress in treatment and Medication management Will increase Risperidone to 2.5 mg po qhs for mood sx, psychosis.  Will continue Cogentin 0.5 mg po qhs  for EPS. Will continue Ativan 1 mg po q6h prn for severe anxiety/agitation. Will continue Lamictal 25 mg po bid for mood lability. Will continue Trazodone 50 mg po qhs for sleep. Will make available PRN medications as per agitation protocol. Will continue to monitor vitals ,medication compliance and treatment side effects while patient is here.  Will monitor for medical issues as well as call consult as needed.  Ordered labs as above- cbc - wnl except for wblc slightly elevated at 13, cmp - wnl, lipid panel, wnl, tsh - wnl, EKG for qtc wnl , pregnancy test - negative. Was able to obtain collateral information from Dr.Agarwal as well as husband - pls see H&P. CSW will continue working on disposition.  Patient to participate in therapeutic milieu .  Rudie Rikard, MD 09/21/2016, 2:00 PM

## 2016-09-21 NOTE — Progress Notes (Signed)
Patient being monitored 1:1 staff in close proximity of patient.  Patient in no imminent danger.

## 2016-09-21 NOTE — Progress Notes (Signed)
Nursing close observation note D:Pt observed in room RR even and unlabored. No distress noted. A: close observation continues for safety  R: pt remains safe

## 2016-09-21 NOTE — Progress Notes (Signed)
Did not attend groups 

## 2016-09-21 NOTE — Progress Notes (Signed)
Patient being monitored 1:1.  Staff within close proximity of patient.  Patient in no imminent danger.

## 2016-09-21 NOTE — Progress Notes (Signed)
Nursing 1:1 note D:Pt observed sleeping in bed with eyes closed. RR even and unlabored. No distress noted. A: 1:1 observation continues for safety  R: pt remains safe  

## 2016-09-21 NOTE — Progress Notes (Signed)
Recreation Therapy Notes  INPATIENT RECREATION THERAPY ASSESSMENT  Patient Details Name: Jessica FranklinDeepa Steele MRN: [id number] DOB: [birth year] Today's Date: 09/21/2016  Patient Stressors:  (Unable to answer)  Coping Skills:    (Unable to answer)  Personal Challenges:  (Unable to answer)  Leisure Interests (2+):   (Unable to answer)  Awareness of Community Resources:   (Unable to answer)  Community Resources:  Unable to answer  Current Use: Unable to answer  If no, Barriers?: Unable to answer  Patient Strengths:  Unable to answer  Patient Identified Areas of Improvement:  Unable to answer  Current Recreation Participation:  Unable to answer  Patient Goal for Hospitalization:  Unable to answer  [place]  [place] to answer  [place]  Unable to answer   Current SI (including self-harm):   (Unable to answer)  Current HI:   (Unable to answer)  Consent to Intern Participation:  (Unable to answer)   Jessica Steele, Jessica Steele  Jessica Steele, Jessica Steele 09/21/2016, 3:13 PM

## 2016-09-21 NOTE — Progress Notes (Signed)
Recreation Therapy Notes  Date: 09/21/16 Time: 1000 Location: 500 Hall Dayroom  Group Topic: Coping Skills  Goal Area(s) Addresses:  Pt will able to identify positive coping skills. Pt will be able to identify the importance of coping skills. Pt will be abel to identify how using coping skills will help them post d/c.  Intervention: Worksheet, scissors, glue sticks, magazines  Activity: My Engineer, siteCoping Skills Worksheet.  Patients were given worksheets divided into five areas where coping skills can be used.  The areas were diversions, social, cognitive, tension releasers and physical.  Patients were to use magazines to cut out pictures that depict coping skills that could be used in each area.  Patients were to come up with at least two coping skills per area.   Education: PharmacologistCoping Skills, Building control surveyorDischarge Planning.   Education Outcome: Acknowledges understanding/In group clarification offered/Needs additional education.   Clinical Observations/Feedback: Pt did not attend group.    Caroll RancherMarjette Starling Jessie, LRT/CTRS     Caroll RancherLindsay, Marianela Mandrell A 09/21/2016 12:32 PM

## 2016-09-22 LAB — HEMOGLOBIN A1C
Hgb A1c MFr Bld: 5.6 % (ref 4.8–5.6)
MEAN PLASMA GLUCOSE: 114 mg/dL

## 2016-09-22 LAB — PROLACTIN: PROLACTIN: 116.6 ng/mL — AB (ref 4.8–23.3)

## 2016-09-22 MED ORDER — RISPERIDONE 0.25 MG PO TABS
0.2500 mg | ORAL_TABLET | Freq: Every day | ORAL | Status: DC
  Administered 2016-09-22: 0.25 mg via ORAL
  Filled 2016-09-22 (×3): qty 1

## 2016-09-22 MED ORDER — TRAZODONE HCL 100 MG PO TABS
100.0000 mg | ORAL_TABLET | Freq: Every day | ORAL | Status: DC
Start: 2016-09-22 — End: 2016-09-23
  Administered 2016-09-22: 100 mg via ORAL
  Filled 2016-09-22 (×3): qty 1

## 2016-09-22 NOTE — Progress Notes (Signed)
D: Pt A & O to self, place, situation and time. Denies SI, HI, AVH and pain at this time. Observed to be tearful at intervals during assessment in her room, however, laughs at jokes as well. Presents with depressed affect and anxious mood. Restless / fidgety at times but is cooperative with assessment.  A: Emotional support and availability provided to pt. Writer encouraged pt to voice needs and comply with current treatment regimen including group attendance. 1:1 D/C at 0948 and Q 15 minutes safety checks initiated as ordered. Verbal education done on observation level change prior to staff removal from pt's room and pt verbalized understanding.  R: Pt did not attend scheduled AM group on unit despite multiple encouragements. Will continue to monitor pt for safety and mood stabilization.

## 2016-09-22 NOTE — Progress Notes (Signed)
Recreation Therapy Notes  Date: 09/22/16 Time: 1000 Location: 500 Hall Dayroom  Group Topic: Leisure Education  Goal Area(s) Addresses:  Patient will identify positive leisure activities.  Patient will identify one positive benefit of participation in leisure activities.   Intervention: Dry erase board, eraser, dry erase marker, various activities on strips of paper  Activity: Leisure Pictionary.  One patient will come to the board and pick a strip of paper from the container, which contains an activity on it, and draw it on the board.  The remaining patients will attempt to guess what is being drawn on the board.  The person that guesses the picture, will go next.  If there are enough participants, the group can be broken up into teams.  Education:  Leisure Education, Building control surveyorDischarge Planning  Education Outcome: Acknowledges education/In group clarification offered/Needs additional education  Clinical Observations/Feedback: Pt did not attend group.   Caroll RancherMarjette Colston Pyle, LRT/CTRS     Caroll RancherLindsay, Thaxton Pelley A 09/22/2016 12:06 PM

## 2016-09-22 NOTE — Progress Notes (Signed)
[facility] Post 1:1 Observation Documentation  For the first (8) hours following discontinuation of 1:1 precautions, a progress note entry by nursing staff should be documented at least every 2 hours, reflecting the patient's behavior, condition, mood, and conversation.  Use the progress notes for additional entries.  Time 1:1 discontinued:  1148  Patient's Behavior: Pt awake, calm at present. Observed coloring on her bed.   Patient's Condition: Calm at this time, appears to be in no physical distress.  Patient's Conversation: "I just want to go home now"  Sherryl MangesWesseh, Sevrin Sally 09/22/2016,1548

## 2016-09-22 NOTE — Plan of Care (Signed)
Problem: Medication: Goal: Ability to use medications correctly will improve Outcome: Progressing Pt is compliant with current medication regimen. Denies adverse drug reactions at this time.   Problem: Self-Concept: Goal: Level of anxiety will decrease Outcome: Not Progressing Pt observed to be restless / fidgety and tearful at intervals during shift. Yelled at her husband on phone but was verbally redirectable. Continue to need verbal encouragement / support. Occupational psychologistApproached writer with report "I'm having a heart attack and panic attack" PRN Ativan given as ordered for anxiety and was effective.

## 2016-09-22 NOTE — Progress Notes (Signed)
[facility] MD Progress Note  09/22/2016 12:52 PM Jessica Steele  MRN:  [id number] Subjective:  Patient states " I am having all these racing thoughts and stress. I was mad at my husband since he did not want to listen to me. I ran outside naked in the night. "  Objective:Jessica Steele is a 35 y old [place] female , married , unemployed , lives with husband and 33 year old son in [place] , who has a hx of Bipolar disorder , presented to [facility] as a walk in for worsening mood sx, sleep issues and psychosis.   Patient seen and chart reviewed.Discussed patient with treatment team.  Patient today is seen as less restless , is more goal directed , continues to be tangential with loose thought process at time , but is more redirectable. Pt often seen with closed eyes in the hallways , praying . Pt continues to have restless sleep , although sleep has improved . Per nursing - pt last evening was seen as appropriate ,was able to hold a good conversation , talked at length about her family , conflicts with mother in law who apparently took her dowry money as well as other assets from her . Pt reported to nursing she does act out to gain attention from her husband , since she feels he does not listen to her otherwise. Pt is compliant on medications , denies ADRs.    Principal Problem: Bipolar disorder, curr episode mixed, severe, with psychotic features (HCC) Diagnosis:   Patient Active Problem List   Diagnosis Date Noted  . Bipolar disorder, curr episode mixed, severe, with psychotic features (HCC) [F31.64] 09/20/2016   Total Time spent with patient: 30 minutes  Past Psychiatric History: Please see H&P.   Past Medical History:  Past Medical History:  Diagnosis Date  . Anxiety   . Bipolar disorder (HCC)   . Depression   . Fast heart beat    associated with anxiety  . PCOS (polycystic ovarian syndrome)   . Schizophrenia [facility])     Past Surgical History:  Procedure Laterality Date  . CESAREAN  SECTION     Family History:  Family History  Problem Relation Age of Onset  . Alcohol abuse Neg Hx   . Anxiety disorder Neg Hx   . Bipolar disorder Neg Hx   . Drug abuse Neg Hx   . Depression Neg Hx   . Schizophrenia Neg Hx   . Suicidality Neg Hx    Family Psychiatric  History: Please see H&P.  Social History: Please see H&P.  History  Alcohol Use No     History  Drug Use No    Social History   Social History  . Marital status: Married    Spouse name: N/A  . Number of children: 1  . Years of education: 52   Social History Main Topics  . Smoking status: Never Smoker  . Smokeless tobacco: Never Used     Comment: non smoker  . Alcohol use No  . Drug use: No  . Sexual activity: Yes    Birth control/ protection: None   Other Topics Concern  . None   Social History Narrative   Married and living with husband and 5yo son. 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 Grew up in [place]. Pt has a BS in engineering and denies problems in school.    Additional Social History:    Pain Medications: denied Prescriptions: risperdal  klonipin Over the Counter: tylenol History of alcohol / drug use?: No history of alcohol / drug abuse Longest period of sobriety (when/how long): never used alcohol, drugs, tobacco Negative Consequences of Use: Financial Withdrawal Symptoms: Other (Comment) (denied withdrawals)                    Sleep: improving  Appetite:  Fair  Current Medications: Current Facility-Administered Medications  Medication Dose Route Frequency Provider Last Rate Last Dose  . acetaminophen (TYLENOL) tablet 650 mg  650 mg Oral Q6H PRN Laveda Abbe, NP      . alum & mag hydroxide-simeth (MAALOX/MYLANTA) 200-200-20 MG/5ML suspension 30 mL  30 mL Oral Q4H PRN Laveda Abbe, NP      . benztropine (COGENTIN) tablet 0.5 mg  0.5 mg Oral QHS Jomarie Longs, MD   0.5 mg at 09/21/16 2224  . lamoTRIgine (LAMICTAL) tablet 25 mg   25 mg Oral BID Laveda Abbe, NP   25 mg at 09/22/16 1007  . lidocaine (LIDODERM) 5 % 1 patch  1 patch Transdermal Q24H Kadey Mihalic, MD      . LORazepam (ATIVAN) tablet 1 mg  1 mg Oral Q6H PRN Jomarie Longs, MD   1 mg at 09/21/16 2224   Or  . LORazepam (ATIVAN) injection 1 mg  1 mg Intramuscular Q6H PRN Thresa Dozier, MD      . magnesium hydroxide (MILK OF MAGNESIA) suspension 30 mL  30 mL Oral Daily PRN Laveda Abbe, NP      . OLANZapine [facility]) tablet 5 mg  5 mg Oral TID PRN Jomarie Longs, MD   5 mg at 09/21/16 0111   Or  . OLANZapine (ZYPREXA) injection 5 mg  5 mg Intramuscular TID PRN Jomarie Longs, MD      . risperiDONE (RISPERDAL) tablet 0.25 mg  0.25 mg Oral Q1200 Aviv Lengacher, MD      . risperiDONE (RISPERDAL) tablet 2.5 mg  2.5 mg Oral QHS Noelie Renfrow, MD   2.5 mg at 09/21/16 2224  . traZODone (DESYREL) tablet 100 mg  100 mg Oral QHS Jomarie Longs, MD        Lab Results:  Results for orders placed or performed during the hospital encounter of 09/19/16 (from the past 48 hour(s))  Urinalysis with microscopic (not at [facility])     Status: Abnormal   Collection Time: 09/20/16  1:00 PM  Result Value Ref Range   Color, Urine YELLOW YELLOW   APPearance CLEAR CLEAR   Specific Gravity, Urine 1.004 (L) 1.005 - 1.030   pH 6.5 5.0 - 8.0   Glucose, UA NEGATIVE NEGATIVE mg/dL   Hgb urine dipstick NEGATIVE NEGATIVE   Bilirubin Urine NEGATIVE NEGATIVE   Ketones, ur NEGATIVE NEGATIVE mg/dL   Protein, ur NEGATIVE NEGATIVE mg/dL   Nitrite NEGATIVE NEGATIVE   Leukocytes, UA NEGATIVE NEGATIVE   WBC, UA 0-5 0 - 5 WBC/hpf   RBC / HPF 0-5 0 - 5 RBC/hpf   Bacteria, UA RARE (A) NONE SEEN   Squamous Epithelial / LPF 0-5 (A) NONE SEEN    Comment: Performed at [facility]  Pregnancy, urine     Status: None   Collection Time: 09/20/16  1:00 PM  Result Value Ref Range   Preg Test, Ur NEGATIVE NEGATIVE    Comment:        THE SENSITIVITY OF  THIS METHODOLOGY IS >20 mIU/mL. Performed at [facility]   TSH  Status: None   Collection Time: 09/21/16  7:11 AM  Result Value Ref Range   TSH 0.597 0.350 - 4.500 uIU/mL    Comment: Performed by a 3rd Generation assay with a functional sensitivity of <=0.01 uIU/mL. Performed at [facility]   CBC with Differential/Platelet     Status: Abnormal   Collection Time: 09/21/16  7:11 AM  Result Value Ref Range   WBC 13.0 (H) 4.0 - 10.5 K/uL   RBC 4.63 3.87 - 5.11 MIL/uL   Hemoglobin 12.8 12.0 - 15.0 g/dL   HCT 16.1 09.6 - 04.5 %   MCV 84.2 78.0 - 100.0 fL   MCH 27.6 26.0 - 34.0 pg   MCHC 32.8 30.0 - 36.0 g/dL   RDW 40.9 81.1 - 91.4 %   Platelets 361 150 - 400 K/uL   Neutrophils Relative % 59 %   Neutro Abs 7.7 1.7 - 7.7 K/uL   Lymphocytes Relative 31 %   Lymphs Abs 4.0 0.7 - 4.0 K/uL   Monocytes Relative 7 %   Monocytes Absolute 0.9 0.1 - 1.0 K/uL   Eosinophils Relative 3 %   Eosinophils Absolute 0.4 0.0 - 0.7 K/uL   Basophils Relative 0 %   Basophils Absolute 0.1 0.0 - 0.1 K/uL    Comment: Performed at [facility]  Lipid panel     Status: Abnormal   Collection Time: 09/21/16  7:11 AM  Result Value Ref Range   Cholesterol 194 0 - 200 mg/dL   Triglycerides 782 (H) <150 mg/dL   HDL 37 (L) >95 mg/dL   Total CHOL/HDL Ratio 5.2 RATIO   VLDL 31 0 - 40 mg/dL   LDL Cholesterol 621 (H) 0 - 99 mg/dL    Comment:        Total Cholesterol/HDL:CHD Risk Coronary Heart Disease Risk Table                     Men   Women  1/2 Average Risk   3.4   3.3  Average Risk       5.0   4.4  2 X Average Risk   9.6   7.1  3 X Average Risk  23.4   11.0        Use the calculated Patient Ratio above and the CHD Risk Table to determine the patient's CHD Risk.        ATP III CLASSIFICATION (LDL):  <100     mg/dL   Optimal  308-657  mg/dL   Near or Above                    Optimal  130-159  mg/dL   Borderline  846-962  mg/dL   High  >952      mg/dL   Very High Performed at [facility]   Hemoglobin A1c     Status: None   Collection Time: 09/21/16  7:11 AM  Result Value Ref Range   Hgb A1c MFr Bld 5.6 4.8 - 5.6 %    Comment: (NOTE)         Pre-diabetes: 5.7 - 6.4         Diabetes: >6.4         Glycemic control for adults with diabetes: <7.0    Mean Plasma Glucose 114 mg/dL    Comment: (NOTE) Performed At: [facility] [address] [place], [place] [id number] Mila Homer MD UU:[id number] Performed at [facility]  Hospital   Prolactin     Status: Abnormal   Collection Time: 09/21/16  7:11 AM  Result Value Ref Range   Prolactin 116.6 (H) 4.8 - 23.3 ng/mL    Comment: (NOTE) Performed At: [facility] [address] [place], [place] [id number] Mila HomerHancock William F MD WU:[id number]:[phone] Performed at [facility]     Blood Alcohol level:  Lab Results  Component Value Date   [facility] <11 11/10/2011    Metabolic Disorder Labs: Lab Results  Component Value Date   HGBA1C 5.6 09/21/2016   MPG 114 09/21/2016   Lab Results  Component Value Date   PROLACTIN 116.6 (H) 09/21/2016   Lab Results  Component Value Date   CHOL 194 09/21/2016   TRIG 156 (H) 09/21/2016   HDL 37 (L) 09/21/2016   CHOLHDL 5.2 09/21/2016   VLDL 31 09/21/2016   LDLCALC 126 (H) 09/21/2016    Physical Findings: AIMS: Facial and Oral Movements Muscles of Facial Expression: None, normal Lips and Perioral Area: None, normal Jaw: None, normal Tongue: None, normal,Extremity Movements Upper (arms, wrists, hands, fingers): None, normal Lower (legs, knees, ankles, toes): None, normal, Trunk Movements Neck, shoulders, hips: None, normal, Overall Severity Severity of abnormal movements (highest score from questions above): None, normal Incapacitation due to abnormal movements: None, normal Patient's awareness of abnormal movements (rate only patient's report): No Awareness, Dental Status Current  problems with teeth and/or dentures?: No Does patient usually wear dentures?: No  CIWA:  CIWA-Ar Total: 1 COWS:  COWS Total Score: 2  Musculoskeletal: Strength & Muscle Tone: within normal limits Gait & Station: normal Patient leans: N/A  Psychiatric Specialty Exam: Physical Exam  Nursing note and vitals reviewed.   Review of Systems  Psychiatric/Behavioral: The patient is nervous/anxious and has insomnia.   All other systems reviewed and are negative.   Blood pressure 117/72, pulse (!) 133, temperature 99.8 F (37.7 C), temperature source Oral, resp. rate 17, height 5\' 2"  (1.575 m), weight 75.8 kg (167 lb), last menstrual period 11/02/2011, SpO2 100 %.Body mass index is 30.54 kg/m.  General Appearance: Guarded  Eye Contact:  Fair  Speech:  Slow  Volume:  Normal  Mood:  Anxious  Affect:  Congruent  Thought Process:  Disorganized, Irrelevant and Descriptions of Associations: Loose improving   Orientation:  Full (Time, Place, and Person)  Thought Content:  Delusions and Tangential improving  Suicidal Thoughts:  No  Homicidal Thoughts:  No  Memory:  Immediate;   Fair Recent;   Poor Remote;   Poor  Judgement:  Impaired  Insight:  Shallow  Psychomotor Activity:  Restlessness  Concentration:  Concentration: Fair and Attention Span: Fair  Recall:  [employer]Fair  Fund of Knowledge:  Fair  Language:  Fair  Akathisia:  No  Handed:  Right  AIMS (if indicated):     Assets:  Desire for Improvement  ADL's:  Intact  Cognition:  WNL  Sleep:  Number of Hours: 5.75     Treatment Plan Summary:Jacilyn Candace Cruisenandan is a [age] y old [place] female , married , unemployed , lives [place] husband [place] 33 year old [place] , who has a hx of Bipolar disorder , [facility] for worsening mood sx, sleep issues and psychosis.   Patient today is seen as less intrusive , more linear , is more redirectable . Will discontinue 1:1 precaution and observe patient on the unit.   Daily contact  with patient to assess and evaluate  symptoms and progress in treatment and Medication management Will increase Risperidone to 0.25 mg po at lunch and  2.5 mg po qhs for mood sx, psychosis.  Will continue Cogentin 0.5 mg po qhs  for EPS. Will continue Ativan 1 mg po q6h prn for severe anxiety/agitation. Will continue Lamictal 25 mg po bid for mood lability. Will increase Trazodone to 100 mg po qhs for sleep. Will make available PRN medications as per agitation protocol. Will continue to monitor vitals ,medication compliance and treatment side effects while patient is here.  Will monitor for medical issues as well as call consult as needed.  Ordered labs as above- cbc - wnl except for wblc slightly elevated at 13, cmp - wnl, lipid panel, wnl, tsh - wnl, EKG for qtc wnl , pregnancy test - negative. Was able to obtain collateral information from Dr.Agarwal as well as husband - pls see H&P. CSW will continue working on disposition.  Patient to participate in therapeutic milieu .  Mekhi Sonn, MD 09/22/2016, 12:52 PM

## 2016-09-22 NOTE — Progress Notes (Signed)
[facility] Post 1:1 Observation Documentation  For the first (8) hours following discontinuation of 1:1 precautions, a progress note entry by nursing staff should be documented at least every 2 hours, reflecting the patient's behavior, condition, mood, and conversation.  Use the progress notes for additional entries.  Time 1:1 discontinued:  1148  Patient's Behavior:  Pt asleep at this time. Respiration noted and unlabored. Patient's Condition:  Calm and appears to be in no physical distress at present. Patient's Conversation: Pt asleep.  :Sherryl MangesWesseh, Sonjia Wilcoxson 09/22/2016, [id number]

## 2016-09-22 NOTE — Progress Notes (Signed)
Nursing 1:1 note D:Pt observed sleeping in bed with eyes closed. RR even and unlabored. No distress noted. A: 1:1 observation continues for safety  R: pt remains safe  

## 2016-09-22 NOTE — Progress Notes (Signed)
Lidoderm patch applied this AM was found in pt's shower during environmental round. Verbal education done on disposal of patch and pt verbalized understanding.

## 2016-09-22 NOTE — [facility] Group Notes (Addendum)
[facility] LCSW Group Therapy  09/22/2016 1:28 PM   Type of Therapy:  Group Therapy   Participation Level:  Engaged  Participation Quality:  Attentive  Affect:  Appropriate   Cognitive:  Alert   Insight:  Engaged  Engagement in Therapy:  Improving   Modes of Intervention:  Education, Exploration, Socialization   Summary of Progress/Problems: Initially engaged, but Jessica Steele became extremely emotional and began crying aloud during the group presentation. She had to leave the group session with nurse tech.   Jessica Steele from [employer] was here to tell his story of recovery, inform patients about [employer] and play his guitar.   Jessica Steele 09/22/2016 1:28 PM

## 2016-09-22 NOTE — Progress Notes (Signed)
[facility] Post 1:1 Observation Documentation  For the first (8) hours following discontinuation of 1:1 precautions, a progress note entry by nursing staff should be documented at least every 2 hours, reflecting the patient's behavior, condition, mood, and conversation.  Use the progress notes for additional entries.  Time 1:1 discontinued:  1148  Patient's Behavior:  Pt went out to courtyard with peers and staff this AM, reportedly walked around in courtyard and sat down. However, she became was very anxious, observed crying and holding on to staff, "please come sit with me, I'm having a heart attack and panic attack now" PRN Ativan given at 1342 for anxiety as ordered.  Patient's Condition:  Anxious and crying on unit.  Patient's Conversation:  "I'm having a heart attack and panic attack now"  Sherryl MangesWesseh, Saturnino Liew 09/22/2016, 1348

## 2016-09-23 MED ORDER — ZIPRASIDONE HCL 40 MG PO CAPS
40.0000 mg | ORAL_CAPSULE | Freq: Every day | ORAL | Status: AC
  Administered 2016-09-23: 40 mg via ORAL
  Filled 2016-09-23: qty 1

## 2016-09-23 MED ORDER — OLANZAPINE 10 MG IM SOLR
10.0000 mg | Freq: Once | INTRAMUSCULAR | Status: AC
  Administered 2016-09-23: 10 mg via INTRAMUSCULAR
  Filled 2016-09-23: qty 10

## 2016-09-23 MED ORDER — ZIPRASIDONE HCL 60 MG PO CAPS
60.0000 mg | ORAL_CAPSULE | Freq: Two times a day (BID) | ORAL | Status: DC
  Administered 2016-09-24 – 2016-10-01 (×15): 60 mg via ORAL
  Filled 2016-09-23 (×3): qty 1
  Filled 2016-09-23: qty 3
  Filled 2016-09-23 (×8): qty 1
  Filled 2016-09-23: qty 3
  Filled 2016-09-23: qty 14
  Filled 2016-09-23 (×4): qty 1
  Filled 2016-09-23: qty 14
  Filled 2016-09-23 (×2): qty 1

## 2016-09-23 MED ORDER — MIRTAZAPINE 7.5 MG PO TABS
7.5000 mg | ORAL_TABLET | Freq: Every day | ORAL | Status: DC
  Administered 2016-09-23 – 2016-09-30 (×8): 7.5 mg via ORAL
  Filled 2016-09-23 (×7): qty 1
  Filled 2016-09-23: qty 7
  Filled 2016-09-23 (×3): qty 1

## 2016-09-23 MED ORDER — OLANZAPINE 10 MG IM SOLR
INTRAMUSCULAR | Status: AC
  Filled 2016-09-23: qty 10

## 2016-09-23 MED ORDER — RISPERIDONE 1 MG PO TABS
2.5000 mg | ORAL_TABLET | Freq: Every day | ORAL | Status: AC
Start: 2016-09-23 — End: 2016-09-23
  Administered 2016-09-23: 2.5 mg via ORAL
  Filled 2016-09-23: qty 2.5

## 2016-09-23 NOTE — Progress Notes (Addendum)
D-  Patient was noted to be up engaged in bizarre behaviors such as covering her eyes and walking through the unit.  Patient got into the shower completely clothed and then went and laid on the bed while wet.  Patient also stated to writer "Is my husband, son and mother dead?  I want to commit suicide."  Patient was also noted to have severe mood swings.  While talking with Clinical research associatewriter, patient was noted to be crying one moment then laughing the next.  Patient also noted to be talking to unseen other while sitting in the dayroom.    A- Assess patient for safety, offer medications as prescribed, redirect patient as needed, assess for effectiveness of medications and adverse reactions.  R- Continue to monitor patient as prescribed. Patient able to contract for safety.  Patient was able to follow staff prompting

## 2016-09-23 NOTE — Progress Notes (Signed)
Pt does not respond well to Clinical research associatewriter. Pt will only nod yes or no, pt will not verbally interact with Clinical research associatewriter.

## 2016-09-23 NOTE — [facility] Group Notes (Signed)
[facility] Group Notes:  (Counselor/Nursing/MHT/Case Management/Adjunct)  09/23/2016 1:15PM  Type of Therapy:  Group Therapy  Participation Level:  Active  Participation Quality:  Appropriate  Affect:  Flat  Cognitive:  Oriented  Insight:  Improving  Engagement in Group:  Limited  Engagement in Therapy:  Limited  Modes of Intervention:  Discussion, Exploration and Socialization  Summary of Progress/Problems: The topic for group was balance in life.  Pt participated in the discussion about when their life was in balance and out of balance and how this feels.  Pt discussed ways to get back in balance and short term goals they can work on to get where they want to be. Invited.  Too agitated to attend.   [name], Laveda Demedeiros B 09/23/2016 3:59 PM

## 2016-09-23 NOTE — Progress Notes (Signed)
   D: Writer observed the pt pacing up and down the hall, at times stopping to use the phone. Pt also brought two bags containing her belongings up the hall and sat them on the floor. Staff instructed the pt to return the bags back to her room.  Pt attempted to assess the writer in her room, however the pt walked away and went into her bathroom, closing the door behind her.  During med pass pt left the writer in the room with the medications and walked up the hall to the mht. Writer asked the mht to walk with the pt back into the room in hopes of getting the pt to take her meds. Pt complied. Pt left the room and called her husband on the telephone  Several minutes later the pt was on the telephone talking to her husband. She came over to the writer tearful, asking if someone could stay in her room. Writer and staff attempted assure the pt that she is safe and encouraged her to lay down alone in her room.  After several mins writer instructed mht to go into the room for apprx 30 min, in hopes of pt falling asleep. Pt has no other questions or concerns.    A:  Pt fell asleep. Support and encouragement was offered. 15 min checks continued for safety.  R: Pt remains safe.

## 2016-09-23 NOTE — Progress Notes (Signed)
[facility] MD Progress Note  09/23/2016 12:54 PM Jessica Steele  MRN:  [id number] Subjective:  Patient states " I feel like God has left me . I saw something coming out of me and going out through that window." ' Do you see that mark on that window ? "   Objective:Jessica Steele is a 35 y old [place] female , married , unemployed , lives with husband and 33 year old son in [place] , who has a hx of Bipolar disorder , presented to [facility] as a walk in for worsening mood sx, sleep issues and psychosis.   Patient seen and chart reviewed.Discussed patient with treatment team.  Patient today is seen as restless, fidgety , anxious , labile often.  Pt seems to be worse today compared to yesterday . Pt seen as being tangential and continues to endorse racing thoughts. Per nursing patient has been talking a lot about death - patient reported to Clinical research associate that  " she wanted to run away and live or die ? " When asked to elaborate on this - pt reported she did not meant to say she wanted to die , she denies SI and stated that she wanted to live.  Per staff - pt appears to be restless, anxious often , talking about death and her husband leaving her. Pt also did not sleep at all last night.  Collateral information obtained from Husband by CSW- as per husband - pt seemed to be very distracted last night and was talking about death when he visited her."     Principal Problem: Bipolar disorder, curr episode mixed, severe, with psychotic features (HCC) Diagnosis:   Patient Active Problem List   Diagnosis Date Noted  . Bipolar disorder, curr episode mixed, severe, with psychotic features (HCC) [F31.64] 09/20/2016   Total Time spent with patient: 30 minutes  Past Psychiatric History: Please see H&P.   Past Medical History:  Past Medical History:  Diagnosis Date  . Anxiety   . Bipolar disorder (HCC)   . Depression   . Fast heart beat    associated with anxiety  . PCOS (polycystic ovarian syndrome)   .  Schizophrenia [facility])     Past Surgical History:  Procedure Laterality Date  . CESAREAN SECTION     Family History:  Family History  Problem Relation Age of Onset  . Alcohol abuse Neg Hx   . Anxiety disorder Neg Hx   . Bipolar disorder Neg Hx   . Drug abuse Neg Hx   . Depression Neg Hx   . Schizophrenia Neg Hx   . Suicidality Neg Hx    Family Psychiatric  History: Please see H&P.  Social History: Please see H&P.  History  Alcohol Use No     History  Drug Use No    Social History   Social History  . Marital status: Married    Spouse name: N/A  . Number of children: 1  . Years of education: 8   Social History Main Topics  . Smoking status: Never Smoker  . Smokeless tobacco: Never Used     Comment: non smoker  . Alcohol use No  . Drug use: No  . Sexual activity: Yes    Birth control/ protection: None   Other Topics Concern  . None   Social History Narrative   Married and living with husband and 5yo son. 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 Grew up in [place]. Pt has a  BS in engineering and denies problems in school.    Additional Social History:    Pain Medications: denied Prescriptions: risperdal   klonipin Over the Counter: tylenol History of alcohol / drug use?: No history of alcohol / drug abuse Longest period of sobriety (when/how long): never used alcohol, drugs, tobacco Negative Consequences of Use: Financial Withdrawal Symptoms: Other (Comment) (denied withdrawals)                    Sleep: Poor  Appetite:  Fair  Current Medications: Current Facility-Administered Medications  Medication Dose Route Frequency Provider Last Rate Last Dose  . acetaminophen (TYLENOL) tablet 650 mg  650 mg Oral Q6H PRN Laveda Abbe, NP      . alum & mag hydroxide-simeth (MAALOX/MYLANTA) 200-200-20 MG/5ML suspension 30 mL  30 mL Oral Q4H PRN Laveda Abbe, NP      . benztropine (COGENTIN) tablet 0.5 mg  0.5 mg Oral QHS  Gaelan Glennon, MD   0.5 mg at 09/22/16 2114  . lamoTRIgine (LAMICTAL) tablet 25 mg  25 mg Oral BID Laveda Abbe, NP   25 mg at 09/22/16 2114  . lidocaine (LIDODERM) 5 % 1 patch  1 patch Transdermal Q24H Jomarie Longs, MD   1 patch at 09/22/16 1020  . LORazepam (ATIVAN) tablet 1 mg  1 mg Oral Q6H PRN Jomarie Longs, MD   1 mg at 09/23/16 0837   Or  . LORazepam (ATIVAN) injection 1 mg  1 mg Intramuscular Q6H PRN Chemere Steffler, MD      . magnesium hydroxide (MILK OF MAGNESIA) suspension 30 mL  30 mL Oral Daily PRN Laveda Abbe, NP      . mirtazapine (REMERON) tablet 7.5 mg  7.5 mg Oral QHS Ahlani Wickes, MD      . OLANZapine (ZYPREXA) tablet 5 mg  5 mg Oral TID PRN Jomarie Longs, MD   5 mg at 09/23/16 0837   Or  . OLANZapine (ZYPREXA) injection 5 mg  5 mg Intramuscular TID PRN Jomarie Longs, MD      . risperiDONE (RISPERDAL) tablet 2.5 mg  2.5 mg Oral QHS Junko Ohagan, MD      . ziprasidone (GEODON) capsule 40 mg  40 mg Oral Q lunch Zena Vitelli, MD      . ziprasidone (GEODON) capsule 40 mg  40 mg Oral Q supper Jomarie Longs, MD      . Melene Muller ON 09/24/2016] ziprasidone (GEODON) capsule 60 mg  60 mg Oral BID WC Kayce Betty, MD        Lab Results:  No results found for this or any previous visit (from the past 48 hour(s)).  Blood Alcohol level:  Lab Results  Component Value Date   [facility] <11 11/10/2011    Metabolic Disorder Labs: Lab Results  Component Value Date   HGBA1C 5.6 09/21/2016   MPG 114 09/21/2016   Lab Results  Component Value Date   PROLACTIN 116.6 (H) 09/21/2016   Lab Results  Component Value Date   CHOL 194 09/21/2016   TRIG 156 (H) 09/21/2016   HDL 37 (L) 09/21/2016   CHOLHDL 5.2 09/21/2016   VLDL 31 09/21/2016   LDLCALC 126 (H) 09/21/2016    Physical Findings: AIMS: Facial and Oral Movements Muscles of Facial Expression: None, normal Lips and Perioral Area: None, normal Jaw: None, normal Tongue: None, normal,Extremity  Movements Upper (arms, wrists, hands, fingers): None, normal Lower (legs, knees, ankles, toes): None, normal, Trunk Movements Neck, shoulders, hips:  None, normal, Overall Severity Severity of abnormal movements (highest score from questions above): None, normal Incapacitation due to abnormal movements: None, normal Patient's awareness of abnormal movements (rate only patient's report): No Awareness, Dental Status Current problems with teeth and/or dentures?: No Does patient usually wear dentures?: No  CIWA:  CIWA-Ar Total: 1 COWS:  COWS Total Score: 2  Musculoskeletal: Strength & Muscle Tone: within normal limits Gait & Station: normal Patient leans: N/A  Psychiatric Specialty Exam: Physical Exam  Nursing note and vitals reviewed.   Review of Systems  Psychiatric/Behavioral: Positive for depression and hallucinations. The patient is nervous/anxious and has insomnia.   All other systems reviewed and are negative.   Blood pressure 130/79, pulse (!) 117, temperature 98.8 F (37.1 C), temperature source Oral, resp. rate 18, height 5\' 2"  (1.575 m), weight 75.8 kg (167 lb), last menstrual period 11/02/2011, SpO2 100 %.Body mass index is 30.54 kg/m.  General Appearance: Fairly Groomed  Eye Contact:  Fair  Speech:  Pressured  Volume:  varies - seems to whisper at times  Mood:  Anxious, Depressed and Dysphoric  Affect:  Labile and Tearful  Thought Process:  Disorganized, Irrelevant and Descriptions of Associations: Loose seems to be worse than yesterday  Orientation:  Full (Time, Place, and Person)  Thought Content:  Delusions and Tangential worse today   Suicidal Thoughts:  No eventhough denies it seems to be preoccupied with death   Homicidal Thoughts:  No  Memory:  Immediate;   Fair Recent;   Poor Remote;   Poor  Judgement:  Impaired  Insight:  Shallow  Psychomotor Activity:  Restlessness  Concentration:  Concentration: Fair and Attention Span: Fair  Recall:  [employer]  Fund of  Knowledge:  Fair  Language:  Fair  Akathisia:  No  Handed:  Right  AIMS (if indicated):     Assets:  Desire for Improvement  ADL's:  Intact  Cognition:  WNL  Sleep:  Number of Hours: 1.25     Treatment Plan Summary:Jessica Steele is a [age] y old [place] female , married , unemployed , lives with husband and 33 year old [place] , who has a hx of Bipolar disorder , [facility] as [facility] for worsening mood sx, sleep issues and psychosis.   Patient's mood lability and restlessness seems to have gotten worse today , sleep was poor last night and she seems to be preoccupied with death. Will readjust medications and continue treatment.    Daily contact with patient to assess and evaluate symptoms and progress in treatment and Medication management Will cross titrate  Risperidone with Geodon. Will start Geodon at 40 mg po bid with meals today for mood lability/psychosis . Will increase to Geodon 60 mg PO bid with meals tomorrow . Discussed with Pharmacist Michelle NasutiElena - higher dose will be less stimulating for her . Will continue Cogentin 0.5 mg po qhs  for EPS. Will continue Ativan 1 mg po q6h prn for severe anxiety/agitation. Will continue Lamictal 25 mg po bid for mood lability. Will discontinue Trazodone for lack of efficacy. Start Remeron 7.5 mg po qhs for sleep tonight. Reassess tomorrow. Will make available PRN medications as per agitation protocol. Will continue to monitor vitals ,medication compliance and treatment side effects while patient is here.  Will monitor for medical issues as well as call consult as needed.  Ordered labs as above- cbc - wnl except for wblc slightly elevated at 13, cmp - wnl, lipid panel, wnl, tsh -  wnl, EKG for qtc wnl , pregnancy test - negative, PL - elevated at 116 - will need to be monitored - likely due to risperidone since she has a hx of the same. Was able to obtain collateral information from Dr.Agarwal as well as husband - pls see H&P. CSW  will continue working on disposition. Collateral information was obtained from husband today per CSW - please see above. Patient to participate in therapeutic milieu .  Cela Newcom, MD 09/23/2016, 12:54 PM

## 2016-09-23 NOTE — Plan of Care (Signed)
Problem: Coping: Goal: Ability to identify and develop effective coping behavior will improve Outcome: Not Progressing Pt had to be place back on 1:1 today due to her erratic behavior.

## 2016-09-23 NOTE — Progress Notes (Signed)
Patient ID: Jessica Steele, female   DOB: 1980-12-12, 35 y.o.   MRN: [id number] Patient is disorganized and agitated.  She is taking mattresses off her bed and pulling them on the floor.  She is also taking the trash and dumping in the bathroom.  Patient is mute and making gestures with her hands.  She has been dancing around the room.  Patient is not able to follow staff's directions.  Due to her agitation and behavior, MD ordered 10 mg injection to be given immediately.  Patient at first would not cooperative with staff and she was told that she would have to be held.  Patient finally agreed to take injection.  Injection was given and she tolerated well.

## 2016-09-23 NOTE — Progress Notes (Signed)
Nursing 1:1 note D:Pt observed sleeping in bed with eyes closed. RR even and unlabored. No distress noted. A: 1:1 observation continues for safety  R: pt remains safe  

## 2016-09-23 NOTE — Tx Team (Signed)
Interdisciplinary Treatment and Diagnostic Plan Update  09/23/2016 Time of Session: 8:36 AM  Shruti Arrey MRN: [id number]  Principal Diagnosis: Bipolar disorder, curr episode mixed, severe, with psychotic features ([place])  Secondary Diagnoses: Principal Problem:   Bipolar disorder, curr episode mixed, severe, with psychotic features ([place])   Current Medications:  Current Facility-Administered Medications  Medication Dose Route Frequency Provider Last Rate Last Dose  . acetaminophen (TYLENOL) tablet 650 mg  650 mg Oral Q6H PRN Ethelene Hal, NP      . alum & mag hydroxide-simeth (MAALOX/MYLANTA) 200-200-20 MG/5ML suspension 30 mL  30 mL Oral Q4H PRN Ethelene Hal, NP      . benztropine (COGENTIN) tablet 0.5 mg  0.5 mg Oral QHS Saramma Eappen, MD   0.5 mg at 09/22/16 2114  . lamoTRIgine (LAMICTAL) tablet 25 mg  25 mg Oral BID Ethelene Hal, NP   25 mg at 09/22/16 2114  . lidocaine (LIDODERM) 5 % 1 patch  1 patch Transdermal Q24H Ursula Alert, MD   1 patch at 09/22/16 1020  . LORazepam (ATIVAN) tablet 1 mg  1 mg Oral Q6H PRN Ursula Alert, MD   1 mg at 09/22/16 1342   Or  . LORazepam (ATIVAN) injection 1 mg  1 mg Intramuscular Q6H PRN Saramma Eappen, MD      . magnesium hydroxide (MILK OF MAGNESIA) suspension 30 mL  30 mL Oral Daily PRN Ethelene Hal, NP      . OLANZapine [facility]) tablet 5 mg  5 mg Oral TID PRN Ursula Alert, MD   5 mg at 09/21/16 0111   Or  . OLANZapine (ZYPREXA) injection 5 mg  5 mg Intramuscular TID PRN Ursula Alert, MD      . risperiDONE (RISPERDAL) tablet 0.25 mg  0.25 mg Oral Q1200 Saramma Eappen, MD   0.25 mg at 09/22/16 1344  . risperiDONE (RISPERDAL) tablet 2.5 mg  2.5 mg Oral QHS Ursula Alert, MD   2.5 mg at 09/22/16 2114  . traZODone (DESYREL) tablet 100 mg  100 mg Oral QHS Ursula Alert, MD   100 mg at 09/22/16 2114    PTA Medications: Prescriptions Prior to Admission  Medication Sig Dispense Refill Last Dose  .  clonazePAM (KLONOPIN) 1 MG tablet Take 1 mg by mouth 2 (two) times daily as needed for anxiety.   09/18/2016 at Unknown time  . diphenhydrAMINE (BENADRYL) 25 MG tablet Take 1 tablet (25 mg total) by mouth at bedtime as needed for sleep. 30 tablet 0 Past Week at Unknown time  . risperiDONE (RISPERDAL) 1 MG tablet Take 1 tablet (1 mg total) by mouth at bedtime. 30 tablet 2 09/18/2016 at Unknown time    Treatment Modalities: Medication Management, Group therapy, Case management,  1 to 1 session with clinician, Psychoeducation, Recreational therapy.   Physician Treatment Plan for Primary Diagnosis: Bipolar disorder, curr episode mixed, severe, with psychotic features ([place]) Long Term Goal(s): Improvement in symptoms so as ready for discharge  Short Term Goals: Ability to identify changes in lifestyle to reduce recurrence of condition will improve  Medication Management: Evaluate patient's response, side effects, and tolerance of medication regimen.  Therapeutic Interventions: 1 to 1 sessions, Unit Group sessions and Medication administration.  Evaluation of Outcomes: Not Met  Physician Treatment Plan for Secondary Diagnosis: Principal Problem:   Bipolar disorder, curr episode mixed, severe, with psychotic features ([place])   Long Term Goal(s): Improvement in symptoms so as ready for discharge  Short Term Goals: Ability to verbalize feelings  will improve  Medication Management: Evaluate patient's response, side effects, and tolerance of medication regimen.  Therapeutic Interventions: 1 to 1 sessions, Unit Group sessions and Medication administration.  Evaluation of Outcomes: Not Met  09/23/16:Will cross titrate  Risperidone with Geodon. Will start Geodon at 40 mg po bid with meals today for mood lability/psychosis . Will increase to Geodon 60 mg PO bid with meals tomorrow . Discussed with Pharmacist Jiles Garter - higher dose will be less stimulating for her . Will continue Cogentin 0.5 mg po qhs  for EPS. Will continue Ativan 1 mg po q6h prn for severe anxiety/agitation. Will continue Lamictal 25 mg po bid for mood lability. Will discontinue Trazodone for lack of efficacy. Start Remeron 7.5 mg po qhs for sleep tonight. Reassess tomorrow.   RN Treatment Plan for Primary Diagnosis: Bipolar disorder, curr episode mixed, severe, with psychotic features ([place]) Long Term Goal(s): Knowledge of disease and therapeutic regimen to maintain health will improve  Short Term Goals: Ability to verbalize frustration and anger appropriately will improve and Ability to demonstrate self-control  Medication Management: RN will administer medications as ordered by provider, will assess and evaluate patient's response and provide education to patient for prescribed medication. RN will report any adverse and/or side effects to prescribing provider.  Therapeutic Interventions: 1 on 1 counseling sessions, Psychoeducation, Medication administration, Evaluate responses to treatment, Monitor vital signs and CBGs as ordered, Perform/monitor CIWA, COWS, AIMS and Fall Risk screenings as ordered, Perform wound care treatments as ordered.  Evaluation of Outcomes: Not Met   LCSW Treatment Plan for Primary Diagnosis: Bipolar disorder, curr episode mixed, severe, with psychotic features ([place]) Long Term Goal(s): Safe transition to appropriate next level of care at discharge, Engage patient in therapeutic group addressing interpersonal concerns.  Short Term Goals: Engage patient in aftercare planning with referrals and resources  Therapeutic Interventions: Assess for all discharge needs, 1 to 1 time with Social worker, Explore available resources and support systems, Assess for adequacy in community support network, Educate family and significant other(s) on suicide prevention, Complete Psychosocial Assessment, Interpersonal group therapy.  Evaluation of Outcomes: Not Met  Unable to engaged in meanigful  conversation   Progress in Treatment: Attending groups: No Participating in groups: No Taking medication as prescribed: Yes Toleration medication: Yes, no side effects reported at this time Family/Significant other contact made: Yes, with husband Patient understands diagnosis: No  Limited insight Discussing patient identified problems/goals with staff: Yes Medical problems stabilized or resolved: Yes Denies suicidal/homicidal ideation: Yes Issues/concerns per patient self-inventory: None Other: N/A  New problem(s) identified: None identified at this time.   New Short Term/Long Term Goal(s): None identified at this time.   Discharge Plan or Barriers: return home, follow up outpt  Reason for Continuation of Hospitalization: Disorganization Mania Medication stabilization    Estimated Length of Stay: 3-5 days  Attendees: Patient: 09/23/2016  8:36 AM  Physician: Ursula Alert, MD 09/23/2016  8:36 AM  Nursing: Rosetta Posner, RN 09/23/2016  8:36 AM  RN Care Manager: Lars Pinks, RN 09/23/2016  8:36 AM  Social Worker: Ripley Fraise 09/23/2016  8:36 AM  Recreational Therapist: Laretta Bolster  09/23/2016  8:36 AM  Other: Norberto Sorenson 09/23/2016  8:36 AM  Other:  09/23/2016  8:36 AM    Scribe for Treatment Team:  Roque Lias LCSW 09/23/2016 8:36 AM

## 2016-09-24 DIAGNOSIS — Z79899 Other long term (current) drug therapy: Secondary | ICD-10-CM

## 2016-09-24 DIAGNOSIS — Z811 Family history of alcohol abuse and dependence: Secondary | ICD-10-CM

## 2016-09-24 MED ORDER — BENZTROPINE MESYLATE 1 MG PO TABS
1.0000 mg | ORAL_TABLET | Freq: Every day | ORAL | Status: DC
  Administered 2016-09-24 – 2016-09-30 (×7): 1 mg via ORAL
  Filled 2016-09-24: qty 7
  Filled 2016-09-24 (×9): qty 1

## 2016-09-24 NOTE — Progress Notes (Signed)
[facility] Group Notes:  (Nursing/MHT/Case Management/Adjunct)  Date:  09/24/2016  Time:  9:49 PM  Type of Therapy:  Psychoeducational Skills  Participation Level:  None  Participation Quality:  Resistant  Affect:  Irritable  Cognitive:  Lacking  Insight:  None  Engagement in Group:  Resistant  Modes of Intervention:  Education  Summary of Progress/Problems: The patient arrived late for group and stated that she wanted to call her husband first and then she would respond to group.   Hazle CocaGOODMAN, Wayne Brunker S 09/24/2016, 9:49 PM

## 2016-09-24 NOTE — [facility] Group Notes (Signed)
[facility] LCSW Group Therapy  09/24/2016  1:05 PM  Type of Therapy:  Group therapy  Participation Level:  Active  Participation Quality:  Attentive  Affect:  Flat  Cognitive:  Oriented  Insight:  Limited  Engagement in Therapy:  Limited  Modes of Intervention:  Discussion, Socialization  Summary of Progress/Problems:  Chaplain was here to lead a group on themes of hope and courage. Invited.  Chose to not attend.  [name], Amun Stemm B 09/24/2016 3:26 PM

## 2016-09-24 NOTE — Progress Notes (Signed)
Pt refused to remove her Lidocaine patch, when female nurse Maralyn Sago(Sarah) tried to remove the patch, pt became very defensive.

## 2016-09-24 NOTE — Progress Notes (Signed)
1:1 Note: Patient maintained on constant supervision for safety.  No behavioral issues noted.  Medication given as prescribed.  Patient is alert and appropriate.  Patient stayed in her room most of this shift.  Routine safety checks continues.  Patient safe with supervision.  Food and fluid intake encouraged.

## 2016-09-24 NOTE — Progress Notes (Signed)
Patient ID: Jessica FranklinDeepa Steele, female   DOB: 01-May-1981, 35 y.o.   MRN: [id number]  1:1 notes  09/24/2016 @ 2200  D: Patient in room visiting with spouse on approach. Pt reports visit went well. Pt stated ready to discharge but not sure about issues with mother in law and spouse. Pt c/o constipation, pt not sure of last BM. Pt reports appetite not improving. Pt attended evening wrap up group. Pt denies SI/HI/AVH and pain. A: 1:1 observation for safety. Medication administered as prescribed. R: Patient is safe. Sitter at bedside. 1:1 continues

## 2016-09-24 NOTE — Progress Notes (Signed)
Nursing 1:1 note D:Pt observed sleeping in bed with eyes closed. RR even and unlabored. No distress noted. Pt was reportedly getting up going to the bathroom repeatedly and cutting the lights on and off, constantly pouring water on her face, but per sitter pt was able to be redirected.  A: 1:1 observation continues for safety  R: pt remains safe

## 2016-09-24 NOTE — Progress Notes (Signed)
[facility] Progress Note  09/24/2016 3:33 PM Jessica Steele:  [id number] Subjective:  Patient states "I'm good. I'm a lot better actually. I just feel emotionally numb. I just don't really have feelings. I'm just like a baby on the inside. I want to cry but I cant."  Objective:Jessica Steele is a 35 y old [place] female , married , unemployed , lives [place] husband [place] 33 year old [place] , who has a hx of Bipolar disorder , [facility] for worsening mood sx, sleep issues and psychosis.  Pt seen and chart reviewed. Pt is alert/oriented x4, calm, cooperative, and appropriate to situation. Pt denies suicidal/homicidal ideation. However, pt appears to be responding to internal stimuli. She is calm at this time but has been tangential and labile on the unit. She is improving overall in terms of being able to have a conversation and not needing to be redirected. Will continue current regimen below at this time as it is effective thus far.    Principal Problem: Bipolar disorder, curr episode mixed, severe, with psychotic features (HCC) Diagnosis:   Patient Active Problem List   Diagnosis Date Noted  . Bipolar disorder, curr episode mixed, severe, with psychotic features (HCC) [F31.64] 09/20/2016   Total Time spent with patient: 25 minutes   Past Psychiatric History: Please see H&P.   Past Medical History:  Past Medical History:  Diagnosis Date  . Anxiety   . Bipolar disorder (HCC)   . Depression   . Fast heart beat    associated with anxiety  . PCOS (polycystic ovarian syndrome)   . Schizophrenia [facility](HCC)     Past Surgical History:  Procedure Laterality Date  . CESAREAN SECTION     Family History:  Family History  Problem Relation Age of Onset  . Alcohol abuse Neg Hx   . Anxiety disorder Neg Hx   . Bipolar disorder Neg Hx   . Drug abuse Neg Hx   . Depression Neg Hx   . Schizophrenia Neg Hx   . Suicidality Neg Hx    Family Psychiatric  History: Please see  H&P.  Social History: Please see H&P.  History  Alcohol Use No     History  Drug Use No    Social History   Social History  . Marital status: Married    Spouse name: N/A  . Number of children: 1  . Years of education: 3516   Social History Main Topics  . Smoking status: Never Smoker  . Smokeless tobacco: Never Used     Comment: non smoker  . Alcohol use No  . Drug use: No  . Sexual activity: Yes    Birth control/ protection: None   Other Topics Concern  . None   Social History Narrative   Married and living with husband and 5yo son. 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 Grew up in [place]. Pt has a BS in engineering and denies problems in school.    Additional Social History:    Pain Medications: denied Prescriptions: risperdal   klonipin Over the Counter: tylenol History of alcohol / drug use?: No history of alcohol / drug abuse Longest period of sobriety (when/how long): never used alcohol, drugs, tobacco Negative Consequences of Use: Financial Withdrawal Symptoms: Other (Comment) (denied withdrawals)                    Sleep: Poor  Appetite:  Fair  Current Medications: Current Facility-Administered Medications  Medication Dose Route Frequency Provider Last Rate Last Dose  . acetaminophen (TYLENOL) tablet 650 mg  650 mg Oral Q6H PRN Laveda Abbe, NP      . alum & mag hydroxide-simeth (MAALOX/MYLANTA) 200-200-20 MG/5ML suspension 30 mL  30 mL Oral Q4H PRN Laveda Abbe, NP      . benztropine (COGENTIN) tablet 1 mg  1 mg Oral QHS Everardo All Withrow, FNP      . lamoTRIgine (LAMICTAL) tablet 25 mg  25 mg Oral BID Laveda Abbe, NP   25 mg at 09/24/16 0829  . lidocaine (LIDODERM) 5 % 1 patch  1 patch Transdermal Q24H Jomarie Longs, MD   1 patch at 09/24/16 1103  . LORazepam (ATIVAN) tablet 1 mg  1 mg Oral Q6H PRN Jomarie Longs, MD   1 mg at 09/24/16 0509   Or  . LORazepam (ATIVAN) injection 1 mg  1 mg Intramuscular  Q6H PRN Saramma Eappen, MD      . magnesium hydroxide (MILK OF MAGNESIA) suspension 30 mL  30 mL Oral Daily PRN Laveda Abbe, NP      . mirtazapine (REMERON) tablet 7.5 mg  7.5 mg Oral QHS Jomarie Longs, MD   7.5 mg at 09/23/16 2309  . OLANZapine (ZYPREXA) tablet 5 mg  5 mg Oral TID PRN Jomarie Longs, MD   5 mg at 09/24/16 0509   Or  . OLANZapine (ZYPREXA) injection 5 mg  5 mg Intramuscular TID PRN Jomarie Longs, MD      . ziprasidone (GEODON) capsule 60 mg  60 mg Oral BID WC Saramma Eappen, MD   60 mg at 09/24/16 1610    Lab Results:  No results found for this or any previous visit (from the past 48 hour(s)).  Blood Alcohol level:  Lab Results  Component Value Date   [facility] <11 11/10/2011    Metabolic Disorder Labs: Lab Results  Component Value Date   HGBA1C 5.6 09/21/2016   MPG 114 09/21/2016   Lab Results  Component Value Date   PROLACTIN 116.6 (H) 09/21/2016   Lab Results  Component Value Date   CHOL 194 09/21/2016   TRIG 156 (H) 09/21/2016   HDL 37 (L) 09/21/2016   CHOLHDL 5.2 09/21/2016   VLDL 31 09/21/2016   LDLCALC 126 (H) 09/21/2016    Physical Findings: AIMS: Facial and Oral Movements Muscles of Facial Expression: None, normal Lips and Perioral Area: None, normal Jaw: None, normal Tongue: None, normal,Extremity Movements Upper (arms, wrists, hands, fingers): None, normal Lower (legs, knees, ankles, toes): None, normal, Trunk Movements Neck, shoulders, hips: None, normal, Overall Severity Severity of abnormal movements (highest score from questions above): None, normal Incapacitation due to abnormal movements: None, normal Patient's awareness of abnormal movements (rate only patient's report): No Awareness, Dental Status Current problems with teeth and/or dentures?: No Does patient usually wear dentures?: No  CIWA:  CIWA-Ar Total: 1 COWS:  COWS Total Score: 2  Musculoskeletal: Strength & Muscle Tone: within normal limits Gait & Station:  normal Patient leans: N/A  Psychiatric Specialty Exam: Physical Exam  Nursing note and vitals reviewed.   Review of Systems  Psychiatric/Behavioral: Positive for depression and hallucinations. The patient is nervous/anxious and has insomnia.   All other systems reviewed and are negative.   Blood pressure 105/60, pulse (!) 102, temperature 98.8 F (37.1 C), temperature source Oral, resp. rate 18, height 5\' 2"  (1.575 m), weight 75.8 kg (167 lb), last  menstrual period 11/02/2011, SpO2 100 %.Body mass index is 30.54 kg/m.  General Appearance: Casual and Fairly Groomed  Eye Contact:  Fair  Speech:  Pressured  Volume:  varies - seems to whisper at times  Mood:  Dysphoric  Affect:  Labile yet easily redirected  Thought Process:  Disorganized, Irrelevant and Descriptions of Associations: Loose improving  Orientation:  Full (Time, Place, and Person)  Thought Content:  Delusions and Tangential improving   Suicidal Thoughts:  No   Homicidal Thoughts:  No  Memory:  Immediate;   Fair Recent;   Poor Remote;   Poor  Judgement:  Impaired  Insight:  Shallow  Psychomotor Activity:  Restlessness  Concentration:  Concentration: Fair and Attention Span: Fair  Recall:  [employer] of Knowledge:  Fair  Language:  Fair  Akathisia:  No  Handed:  Right  AIMS (if indicated):     Assets:  Desire for Improvement  ADL's:  Intact  Cognition:  WNL  Sleep:  Number of Hours: 4.75   Treatment Plan Summary:Jessica Steele is a 25 y old [place] female , married , unemployed , lives with husband and 40 year old son in [place] , who has a hx of Bipolar disorder , presented to [facility] as a walk in for worsening mood sx, sleep issues and psychosis.  Bipolar disorder, curr episode mixed, severe, with psychotic features (HCC) unstable yet improving. I have reviewed treatment plan below and medication regimen on 09/24/16 and will continue as pt is showing improvement.      Daily contact with patient to assess and  evaluate symptoms and progress in treatment and Medication management -Will cross titrate Risperidone with Geodon. Will start Geodon at 40 mg po bid with meals today for mood lability/psychosis . Will increase to Geodon 60 mg PO bid with meals tomorrow . Discussed with Pharmacist Michelle Nasuti - higher dose will be less stimulating for her.  Will continue Cogentin 0.5 mg po qhs  for EPS.  Will continue Ativan 1 mg po q6h prn for severe anxiety/agitation. Will continue Lamictal 25 mg po bid for mood lability.  Will discontinue Trazodone for lack of efficacy. Start Remeron 7.5 mg po qhs for sleep tonight. Reassess tomorrow. Will make available PRN medications as per agitation protocol. Will continue to monitor vitals ,medication compliance and treatment side effects while patient is here.  Will monitor for medical issues as well as call consult as needed.  Ordered labs as above- cbc - wnl except for wblc slightly elevated at 13, cmp - wnl, lipid panel, wnl, tsh - wnl, EKG for qtc wnl , pregnancy test - negative, PL - elevated at 116 - will need to be monitored - likely due to risperidone since she has a hx of the same. Was able to obtain collateral information from Dr.Agarwal as well as husband - pls see H&P. CSW will continue working on disposition. Collateral information was obtained from husband today per CSW - please see above. Patient to participate in therapeutic milieu.   Beau Fanny, FNP 09/24/2016, 3:33 PM

## 2016-09-24 NOTE — Progress Notes (Signed)
Pt continues to be very manipulative and appears delusional at times, by trying to go into the bathroom  And get in the shower with her clothes on.

## 2016-09-24 NOTE — Progress Notes (Signed)
1:1 Note: Patient maintained on constant supervision for safety.  Patient is in her room pacing back and forth to the bathroom.  No behavioral issue noted or reported.  Routine safety checks maintained.  Patient safe with supervision.

## 2016-09-24 NOTE — Progress Notes (Signed)
Recreation Therapy Notes  Date: 09/24/16 Time: 1000 Location: 500 Hall Dayroom  Group Topic: Leisure Education  Goal Area(s) Addresses:  Patient will identify positive leisure activities.  Patient will identify one positive benefit of participation in leisure activities.   Intervention: 20 plastic cups, 2 soft foam balls  Activity: Bowling.  Patients were divided into two teams.  Each patient got two chances to knock down all the "pins".  Patients received a point for each "pin" they knocked.  The first team to reach 100 won.    Education:  Leisure Education, Building control surveyorDischarge Planning  Education Outcome: Acknowledges education/In group clarification offered/Needs additional education  Clinical Observations/Feedback: Pt did not attend group.   Caroll RancherMarjette Aamori Mcmasters, LRT/CTRS         Lillia AbedLindsay, Xaden Kaufman A 09/24/2016 1:16 PM

## 2016-09-24 NOTE — Plan of Care (Signed)
Problem: Safety: Goal: Periods of time without injury will increase Outcome: Progressing Pt is safe, denies SI intent or plan.

## 2016-09-24 NOTE — Progress Notes (Signed)
1:1 Note: Patient maintained on constant supervision for safety.  Patient slept for few hours this morning.  Patient is alert and oriented to person and place.  Medication given as prescribed.  Patient responding at times to internal stimuli.  Patient is in her room resting in bed.

## 2016-09-24 NOTE — Progress Notes (Signed)
Nursing 1:1 note D:Pt observed sleeping in bed with eyes closed. RR even and unlabored. No distress noted. A: 1:1 observation continues for safety  R: pt remains safe  

## 2016-09-25 NOTE — Progress Notes (Signed)
Patient ID: Jessica FranklinDeepa Schoenfelder, female   DOB: 1981/11/07, 35 y.o.   MRN: [id number]  [facility], [place]RN. Pt is safe and on continues 1:1

## 2016-09-25 NOTE — Progress Notes (Signed)
1:1 Note : Patient maintained on constant supervision for safety.  Patient visible in milieu for activities.  Medication given as prescribed.  Patient had 75% of supper with adequate fluid intake.  Patient behavior is appropriate to situation.  Routine safety checks continues.  Patient safe with supervision.

## 2016-09-25 NOTE — Progress Notes (Signed)
Adult Psychoeducational Group Note  Date:  09/25/2016 Time:  10:36 AM  Group Topic/Focus:  Making Healthy Choices:   The focus of this group is to help patients identify negative/unhealthy choices they were using prior to admission and identify positive/healthier coping strategies to replace them upon discharge.   Participation Level:  Active  Participation Quality:  Appropriate and Attentive  Affect:  Appropriate  Cognitive:  Alert and Appropriate  Insight: Appropriate and Good  Engagement in Group:  Engaged  Modes of Intervention:  Discussion and Education   Mickie Baillizabeth O Iwenekha 09/25/2016, 10:36 AM

## 2016-09-25 NOTE — Progress Notes (Signed)
1:1 Note: Patient maintained on constant supervision for safety.  Patient is awake and alert.  Attended group and participated.  Patient in and out of her room few times to use the phone.  Food and fluid intake encouraged.  Medication given as prescribed.  Routine safety checks maintained.   Denies pain, auditory and visual hallucination.  Patient observed laughing inappropriately and responding to internal stimuli.  Patient safe with supervision.

## 2016-09-25 NOTE — Progress Notes (Signed)
Writer spoke with patient 1:1 and she had fallen asleep and had to be woke up for her to take her medications. Writer spoke with her and asked how she was feeling. She reports that she feels fine and wants to go home. She feels that someone is always watching her here. Writer explained to her the reason she is on a 1:1 d/t to her behavior when she was first admitted. She reports that she misses her son and her mother with whom she talks to everyday. Writer explained the importance of her taking her medications and getting proper rest so that she can return home. She denies having pain, -si/hi/a/v hallucinations. She requested a snack which she received along with gatorade. Safety maintained with sitter at patient's bedside. 1:1 continues and patient is safe.

## 2016-09-25 NOTE — Progress Notes (Signed)
1:1 Note: Patient maintained on constant supervision for safety.  Patient is more visible in milieu and is more appropriate on the unit.  Patient is compliant with prescribed medications.  Attended group and participated.  Appetite poor due to food preference.  Routine safety checks continues.  Patient safe with supervision.

## 2016-09-25 NOTE — Progress Notes (Signed)
Patient ID: Jessica Steele, female   DOB: 10/09/1981, 35 y.o.   MRN: [id number]  1:1 notes  09/25/2016 @ 0600    D: Patient in bed sleeping. Respiration regular and unlabored. No sign of distress noted. A: 1:1 observation for safety R: Patient is safe. Sitter at bedside. 1:1 continues

## 2016-09-25 NOTE — Progress Notes (Signed)
Patient ID: Jessica Steele, female   DOB: Jul 26, 1981, 36 y.o.   MRN: [id number]  1:1 notes  09/25/2016 @ 0200    D: Patient had a bowel movement this morning. Pt appeared anxious. Pt refused medication but later agreed to take it. Pt resting in bed. A: 1:1 observation for safety. Medication administered as prescribed R: Patient is safe. Sitter at bedside. 1:1 continues

## 2016-09-25 NOTE — Progress Notes (Signed)
Did not attend group 

## 2016-09-25 NOTE — [facility] Group Notes (Signed)
[facility] Group Notes:  (Clinical Social Work)  09/25/2016  11:15-12:00PM  Summary of Progress/Problems:   Today's process group involved patients discussing their feelings related to being hospitalized, as well as benefits they see to being in the hospital.  These were itemized on the whiteboard, and then the group brainstormed specific ways in which they could seek for those same benefits to happen when they discharge and go back home. The patient expressed a primary feeling about being hospitalized is "very sad."  She participated initially in making the list on the board and had appropriate suggestions.  She later left the group and did not return.  Type of Therapy:  Group Therapy - Process  Participation Level:  Active  Participation Quality:  Attentive and Sharing  Affect:  Blunted  Cognitive:  Disorganized  Insight:  Limited  Engagement in Therapy:  Limited  Modes of Intervention:  Exploration, Discussion  Ambrose MantleMareida Grossman-Orr, LCSW 09/25/2016, 1:09 PM

## 2016-09-25 NOTE — Progress Notes (Signed)
[facility] MD Progress Note  09/25/2016 10:09 AM Jessica Steele  MRN:  [id number] Subjective:  Patient states "I feel better. I feel almost the same as yesterday. I think we should only do things you would tell your mother about and I did something bad. I don't want to tell and no one knows but I did something bad I wouldn't tell anyone about."   Objective:Jessica Steele is a 35 y old [place] female , married , unemployed , lives with husband and 14 year old son in [place] , who has a hx of Bipolar disorder , presented to [facility] as a walk in for worsening mood sx, sleep issues and psychosis.  Pt seen and chart reviewed. Pt is alert/oriented x4, calm, cooperative, and appropriate to situation. Pt denies suicidal/homicidal ideation. Pt denies psychosis and does not appear to be responding to internal stimuli. She appears to be more pleasant, not agitated, and is improving overall in her ability to focus on the conversation. She is smiling intermittently although inappropriately when asked questions. Will continue 1:1 due to recent severe destructive behaviors in her room and will evaluate removal of 1:1 on a daily basis.     Principal Problem: Bipolar disorder, curr episode mixed, severe, with psychotic features (HCC) Diagnosis:   Patient Active Problem List   Diagnosis Date Noted  . Bipolar disorder, curr episode mixed, severe, with psychotic features (HCC) [F31.64] 09/20/2016   Total Time spent with patient: 25 minutes   Past Psychiatric History: Please see H&P.   Past Medical History:  Past Medical History:  Diagnosis Date  . Anxiety   . Bipolar disorder (HCC)   . Depression   . Fast heart beat    associated with anxiety  . PCOS (polycystic ovarian syndrome)   . Schizophrenia [facility])     Past Surgical History:  Procedure Laterality Date  . CESAREAN SECTION     Family History:  Family History  Problem Relation Age of Onset  . Alcohol abuse Neg Hx   . Anxiety disorder Neg Hx   . Bipolar  disorder Neg Hx   . Drug abuse Neg Hx   . Depression Neg Hx   . Schizophrenia Neg Hx   . Suicidality Neg Hx    Family Psychiatric  History: Please see H&P.  Social History: Please see H&P.  History  Alcohol Use No     History  Drug Use No    Social History   Social History  . Marital status: Married    Spouse name: N/A  . Number of children: 1  . Years of education: 22   Social History Main Topics  . Smoking status: Never Smoker  . Smokeless tobacco: Never Used     Comment: non smoker  . Alcohol use No  . Drug use: No  . Sexual activity: Yes    Birth control/ protection: None   Other Topics Concern  . None   Social History Narrative   Married and living with husband and 5yo son. 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 Grew up in [place]. Pt has a BS in engineering and denies problems in school.    Additional Social History:    Pain Medications: denied Prescriptions: risperdal   klonipin Over the Counter: tylenol History of alcohol / drug use?: No history of alcohol / drug abuse Longest period of sobriety (when/how long): never used alcohol, drugs, tobacco Negative Consequences of Use: Financial Withdrawal Symptoms: Other (Comment) (denied withdrawals)  Sleep: Poor  Appetite:  Fair  Current Medications: Current Facility-Administered Medications  Medication Dose Route Frequency Provider Last Rate Last Dose  . acetaminophen (TYLENOL) tablet 650 mg  650 mg Oral Q6H PRN Laveda Abbe, NP      . alum & mag hydroxide-simeth (MAALOX/MYLANTA) 200-200-20 MG/5ML suspension 30 mL  30 mL Oral Q4H PRN Laveda Abbe, NP      . benztropine (COGENTIN) tablet 1 mg  1 mg Oral QHS Beau Fanny, FNP   1 mg at 09/24/16 2112  . lamoTRIgine (LAMICTAL) tablet 25 mg  25 mg Oral BID Laveda Abbe, NP   25 mg at 09/25/16 0800  . lidocaine (LIDODERM) 5 % 1 patch  1 patch Transdermal Q24H Jomarie Longs, MD   1 patch  at 09/24/16 1103  . LORazepam (ATIVAN) tablet 1 mg  1 mg Oral Q6H PRN Jomarie Longs, MD   1 mg at 09/25/16 0133   Or  . LORazepam (ATIVAN) injection 1 mg  1 mg Intramuscular Q6H PRN Saramma Eappen, MD      . magnesium hydroxide (MILK OF MAGNESIA) suspension 30 mL  30 mL Oral Daily PRN Laveda Abbe, NP   30 mL at 09/24/16 2020  . mirtazapine (REMERON) tablet 7.5 mg  7.5 mg Oral QHS Jomarie Longs, MD   7.5 mg at 09/24/16 2111  . OLANZapine (ZYPREXA) tablet 5 mg  5 mg Oral TID PRN Jomarie Longs, MD   5 mg at 09/24/16 0509   Or  . OLANZapine (ZYPREXA) injection 5 mg  5 mg Intramuscular TID PRN Jomarie Longs, MD      . ziprasidone (GEODON) capsule 60 mg  60 mg Oral BID WC Saramma Eappen, MD   60 mg at 09/25/16 0759    Lab Results:  No results found for this or any previous visit (from the past 48 hour(s)).  Blood Alcohol level:  Lab Results  Component Value Date   [facility] <11 11/10/2011    Metabolic Disorder Labs: Lab Results  Component Value Date   HGBA1C 5.6 09/21/2016   MPG 114 09/21/2016   Lab Results  Component Value Date   PROLACTIN 116.6 (H) 09/21/2016   Lab Results  Component Value Date   CHOL 194 09/21/2016   TRIG 156 (H) 09/21/2016   HDL 37 (L) 09/21/2016   CHOLHDL 5.2 09/21/2016   VLDL 31 09/21/2016   LDLCALC 126 (H) 09/21/2016    Physical Findings: AIMS: Facial and Oral Movements Muscles of Facial Expression: None, normal Lips and Perioral Area: None, normal Jaw: None, normal Tongue: None, normal,Extremity Movements Upper (arms, wrists, hands, fingers): None, normal Lower (legs, knees, ankles, toes): None, normal, Trunk Movements Neck, shoulders, hips: None, normal, Overall Severity Severity of abnormal movements (highest score from questions above): None, normal Incapacitation due to abnormal movements: None, normal Patient's awareness of abnormal movements (rate only patient's report): No Awareness, Dental Status Current problems with teeth and/or  dentures?: No Does patient usually wear dentures?: No  CIWA:  CIWA-Ar Total: 1 COWS:  COWS Total Score: 2  Musculoskeletal: Strength & Muscle Tone: within normal limits Gait & Station: normal Patient leans: N/A  Psychiatric Specialty Exam: Physical Exam  Nursing note and vitals reviewed.   Review of Systems  Psychiatric/Behavioral: Positive for depression and hallucinations. The patient is nervous/anxious and has insomnia.   All other systems reviewed and are negative.   Blood pressure 105/60, pulse (!) 102, temperature 98.8 F (37.1 C), temperature source Oral, resp. rate 18,  height 5\' 2"  (1.575 m), weight 75.8 kg (167 lb), last menstrual period 11/02/2011, SpO2 100 %.Body mass index is 30.54 kg/m.  General Appearance: Casual and Fairly Groomed  Eye Contact:  Fairyet  improving  Speech:  Normal rate, clear and coherent  Volume:  Normal  Mood:  flat  Affect:  Labile yet easily redirected  Thought Process:  Disorganized and Descriptions of Associations: Loose improving  Orientation:  Full (Time, Place, and Person)  Thought Content:  Tangential improving   Suicidal Thoughts:  No   Homicidal Thoughts:  No  Memory:  Immediate;   Fair Recent;   Poor Remote;   Poor  Judgement:  Impaired  Insight:  Shallow  Psychomotor Activity:  Restlessness  Concentration:  Concentration: Fair and Attention Span: Fair  Recall:  [employer]  Fund of Knowledge:  Fair  Language:  Fair  Akathisia:  No  Handed:  Right  AIMS (if indicated):     Assets:  Desire for Improvement  ADL's:  Intact  Cognition:  WNL  Sleep:  Number of Hours: 5   Treatment Plan Summary:Jessica Steele is a [age] y old Asian [place] female , married , unemployed , lives with husband and 324 year old [place] in [place] , who has a hx of Bipolar disorder , [facility] for worsening mood sx, sleep issues and psychosis.  Bipolar disorder, curr episode mixed, severe, with psychotic features (HCC) unstable yet improving. I  have reviewed treatment plan below and medication regimen on 09/25/16 and will continue as pt is showing improvement.      Daily contact with patient to assess and evaluate symptoms and progress in treatment and Medication management -Will cross titrate Risperidone with Geodon. Will start Geodon at 40 mg po bid with meals today for mood lability/psychosis . Will increase to Geodon 60 mg PO bid with meals tomorrow . Discussed with Pharmacist Michelle NasutiElena - higher dose will be less stimulating for her.  Will continue Cogentin 0.5 mg po qhs  for EPS.  Will continue Ativan 1 mg po q6h prn for severe anxiety/agitation. Will continue Lamictal 25 mg po bid for mood lability.  Will discontinue Trazodone for lack of efficacy. Start Remeron 7.5 mg po qhs for sleep tonight. Reassess tomorrow. Will make available PRN medications as per agitation protocol. Will continue to monitor vitals ,medication compliance and treatment side effects while patient is here.  Will monitor for medical issues as well as call consult as needed.  Ordered labs as above- cbc - wnl except for wblc slightly elevated at 13, cmp - wnl, lipid panel, wnl, tsh - wnl, EKG for qtc wnl , pregnancy test - negative, PL - elevated at 116 - will need to be monitored - likely due to risperidone since she has a hx of the same. Was able to obtain collateral information from Dr.Agarwal as well as husband - pls see H&P. CSW will continue working on disposition. Collateral information was obtained from husband today per CSW - please see above. Patient to participate in therapeutic milieu.   Beau FannyWithrow, John C, FNP 09/25/2016, 10:09 AM  I agree with findings and treatment plan of this patient

## 2016-09-26 NOTE — [facility] Group Notes (Signed)
[facility] Group Notes:  (Clinical Social Work)  09/26/2016  11:00AM-12:00PM  Summary of Progress/Problems:  The main focus of today's process group was to listen to a variety of genres of music and to identify that different types of music provoke different responses.  The patient then was able to identify personally what was soothing for them, as well as energizing, as well as how patient can personally use this knowledge in sleep habits, with depression, and with other symptoms.  The patient came in and out of group numerous times, and her face indicated a labile mood.  Type of Therapy:  Music Therapy   Participation Level:  Minimal  Participation Quality:  Inattentive  Affect:  Blunted  Cognitive:  Confused and Disorganized  Insight:  Limited  Engagement in Therapy:  Limited  Modes of Intervention:   Activity, Exploration  Ambrose MantleMareida Grossman-Orr, LCSW 09/26/2016

## 2016-09-26 NOTE — Progress Notes (Signed)
Pt has been in the room resting. Pt ate her dinner and has been calm. No unwanted behavior reported or observed. Maintained on 1:1 for safety.

## 2016-09-26 NOTE — [facility] Group Notes (Signed)
[facility] Group Notes:  (Nursing/MHT/Case Management/Adjunct)  Date:  09/26/2016  Time:  4:16 PM   Type of Therapy:  Psychoeducational Skills  Participation Level:  Did Not Attend  Participation Quality:  DID NOT ATTEND  Affect:  DID NOT ATTEND  Cognitive:  DID NOT ATTEND  Insight:  None  Engagement in Group:  DID NOT ATTEND  Modes of Intervention:  DID NOT ATTEND  Summary of Progress/Problems: Pt did not attend patient self inventory group.   Bethann PunchesJane O Constantino Starace 09/26/2016, 4:16 PM

## 2016-09-26 NOTE — Progress Notes (Signed)
Pt is calm and continue to be cooperative with unit rules. Pt has been on the phone talking to family member, no sign of unwanted behavior, maintained on 1:1, will continue to monitor.

## 2016-09-26 NOTE — Progress Notes (Signed)
Did not attend group 

## 2016-09-26 NOTE — Progress Notes (Signed)
Patient lying in bed asleep with eyes closed and respirations even and unlabored. No distress noted. 1:1 continues and patient is safe.

## 2016-09-26 NOTE — Progress Notes (Signed)
1:1 Nursing note- Patient is currently sittting up in bed eating popcorn for her snack. She has been up briefly to use the phone and to take her medications. Her husband and son visited her earlier this evening. She has been more talkative and pleasant. 1:1 continues and patient is safe.

## 2016-09-26 NOTE — Progress Notes (Signed)
Patient currently lying in bed asleep no distress noted. She had gotten up earlier and walked down the hall but returned to room. Safety maintained on unit with MHT at bedside. 1:1 continues and patient is safe.

## 2016-09-26 NOTE — Progress Notes (Signed)
Pt is calm and cooperative, pt walking on the hallway at this time, no sign of distress or unwanted behavior. Pt maintained on 1:1, will continue to monitor.

## 2016-09-26 NOTE — Progress Notes (Signed)
[facility] MD Progress Note  09/26/2016 2:19 PM Joniqua Sidle  MRN:  [id number] Subjective:  Patient states "I feel good today. I feel like I'm doing well."  Objective:Brittyn Zweig is a 84 y old [place] female , married , unemployed , lives with husband and 33 year old son in [place] , who has a hx of Bipolar disorder , presented to [facility] as a walk in for worsening mood sx, sleep issues and psychosis.  Pt seen and chart reviewed. Pt is alert/oriented x4, calm, cooperative, and appropriate to situation. Pt denies suicidal/homicidal ideation. Pt denies psychosis and does not appear to be responding to internal stimuli. She is still constricted and minimally interaction yet improving greatly in terms of no evident agitation at this time. The safety sitter said pt has been quiet all day and not interacting much with others but not agitated.   Principal Problem: Bipolar disorder, curr episode mixed, severe, with psychotic features (HCC) Diagnosis:   Patient Active Problem List   Diagnosis Date Noted  . Bipolar disorder, curr episode mixed, severe, with psychotic features (HCC) [F31.64] 09/20/2016   Total Time spent with patient: 25 minutes   Past Psychiatric History: Please see H&P.   Past Medical History:  Past Medical History:  Diagnosis Date  . Anxiety   . Bipolar disorder (HCC)   . Depression   . Fast heart beat    associated with anxiety  . PCOS (polycystic ovarian syndrome)   . Schizophrenia [facility])     Past Surgical History:  Procedure Laterality Date  . CESAREAN SECTION     Family History:  Family History  Problem Relation Age of Onset  . Alcohol abuse Neg Hx   . Anxiety disorder Neg Hx   . Bipolar disorder Neg Hx   . Drug abuse Neg Hx   . Depression Neg Hx   . Schizophrenia Neg Hx   . Suicidality Neg Hx    Family Psychiatric  History: Please see H&P.  Social History: Please see H&P.  History  Alcohol Use No     History  Drug Use No    Social History   Social  History  . Marital status: Married    Spouse name: N/A  . Number of children: 1  . Years of education: 70   Social History Main Topics  . Smoking status: Never Smoker  . Smokeless tobacco: Never Used     Comment: non smoker  . Alcohol use No  . Drug use: No  . Sexual activity: Yes    Birth control/ protection: None   Other Topics Concern  . None   Social History Narrative   Married and living with husband and 5yo son. 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 Grew up in [place]. Pt has a BS in engineering and denies problems in school.    Additional Social History:    Pain Medications: denied Prescriptions: risperdal   klonipin Over the Counter: tylenol History of alcohol / drug use?: No history of alcohol / drug abuse Longest period of sobriety (when/how long): never used alcohol, drugs, tobacco Negative Consequences of Use: Financial Withdrawal Symptoms: Other (Comment) (denied withdrawals)                    Sleep: Poor  Appetite:  Fair  Current Medications: Current Facility-Administered Medications  Medication Dose Route Frequency Provider Last Rate Last Dose  . acetaminophen (TYLENOL) tablet 650 mg  650 mg Oral Q6H  PRN Laveda Abbe, NP      . alum & mag hydroxide-simeth (MAALOX/MYLANTA) 200-200-20 MG/5ML suspension 30 mL  30 mL Oral Q4H PRN Laveda Abbe, NP      . benztropine (COGENTIN) tablet 1 mg  1 mg Oral QHS Beau Fanny, FNP   1 mg at 09/25/16 2121  . lamoTRIgine (LAMICTAL) tablet 25 mg  25 mg Oral BID Laveda Abbe, NP   25 mg at 09/26/16 0932  . lidocaine (LIDODERM) 5 % 1 patch  1 patch Transdermal Q24H Jomarie Longs, MD   1 patch at 09/26/16 0932  . LORazepam (ATIVAN) tablet 1 mg  1 mg Oral Q6H PRN Jomarie Longs, MD   1 mg at 09/25/16 2121   Or  . LORazepam (ATIVAN) injection 1 mg  1 mg Intramuscular Q6H PRN Saramma Eappen, MD      . magnesium hydroxide (MILK OF MAGNESIA) suspension 30 mL  30 mL Oral  Daily PRN Laveda Abbe, NP   30 mL at 09/24/16 2020  . mirtazapine (REMERON) tablet 7.5 mg  7.5 mg Oral QHS Jomarie Longs, MD   7.5 mg at 09/25/16 2121  . OLANZapine (ZYPREXA) tablet 5 mg  5 mg Oral TID PRN Jomarie Longs, MD   5 mg at 09/24/16 0509   Or  . OLANZapine (ZYPREXA) injection 5 mg  5 mg Intramuscular TID PRN Jomarie Longs, MD      . ziprasidone (GEODON) capsule 60 mg  60 mg Oral BID WC Saramma Eappen, MD   60 mg at 09/26/16 0846    Lab Results:  No results found for this or any previous visit (from the past 48 hour(s)).  Blood Alcohol level:  Lab Results  Component Value Date   [facility] <11 11/10/2011    Metabolic Disorder Labs: Lab Results  Component Value Date   HGBA1C 5.6 09/21/2016   MPG 114 09/21/2016   Lab Results  Component Value Date   PROLACTIN 116.6 (H) 09/21/2016   Lab Results  Component Value Date   CHOL 194 09/21/2016   TRIG 156 (H) 09/21/2016   HDL 37 (L) 09/21/2016   CHOLHDL 5.2 09/21/2016   VLDL 31 09/21/2016   LDLCALC 126 (H) 09/21/2016    Physical Findings: AIMS: Facial and Oral Movements Muscles of Facial Expression: None, normal Lips and Perioral Area: None, normal Jaw: None, normal Tongue: None, normal,Extremity Movements Upper (arms, wrists, hands, fingers): None, normal Lower (legs, knees, ankles, toes): None, normal, Trunk Movements Neck, shoulders, hips: None, normal, Overall Severity Severity of abnormal movements (highest score from questions above): None, normal Incapacitation due to abnormal movements: None, normal Patient's awareness of abnormal movements (rate only patient's report): No Awareness, Dental Status Current problems with teeth and/or dentures?: No Does patient usually wear dentures?: No  CIWA:  CIWA-Ar Total: 1 COWS:  COWS Total Score: 2  Musculoskeletal: Strength & Muscle Tone: within normal limits Gait & Station: normal Patient leans: N/A  Psychiatric Specialty Exam: Physical Exam  Nursing note  and vitals reviewed.   Review of Systems  Psychiatric/Behavioral: Positive for depression. Negative for hallucinations. The patient is nervous/anxious and has insomnia.   All other systems reviewed and are negative.   Blood pressure 120/70, pulse (!) 110, temperature 98.7 F (37.1 C), temperature source Oral, resp. rate 16, height 5\' 2"  (1.575 m), weight 75.8 kg (167 lb), last menstrual period 11/02/2011, SpO2 100 %.Body mass index is 30.54 kg/m.  General Appearance: Casual and Fairly Groomed  Eye Contact:  Good  Speech:  Normal rate, clear and coherent  Volume:  Normal  Mood:  flat  Affect:  Labile yet easily redirected  Thought Process:  Disorganized and Descriptions of Associations: Loose improving  Orientation:  Full (Time, Place, and Person)  Thought Content:  Not spontaneous improving   Suicidal Thoughts:  No   Homicidal Thoughts:  No  Memory:  Immediate;   Fair Recent;   Poor Remote;   Poor  Judgement:  Impaired  Insight:  Shallow  Psychomotor Activity:  Restlessness  Concentration:  Concentration: Fair and Attention Span: Fair  Recall:  [employer]Fair  Fund of Knowledge:  Fair  Language:  Fair  Akathisia:  No  Handed:  Right  AIMS (if indicated):     Assets:  Desire for Improvement  ADL's:  Intact  Cognition:  WNL  Sleep:  Number of Hours: 5   Treatment Plan Summary:Lezlie Candace Cruisenandan is a [age] y old [place] female , married , unemployed , lives with husband and 33 year old [place] , who has a hx of Bipolar disorder , [facility] for worsening mood sx, sleep issues and psychosis.  Bipolar disorder, curr episode mixed, severe, with psychotic features (HCC) unstable yet improving. I have reviewed treatment plan below and medication regimen on 09/26/16 and will continue as pt is showing improvement.      Daily contact with patient to assess and evaluate symptoms and progress in treatment and Medication management -Will cross titrate Risperidone with  Geodon. Will start Geodon at 40 mg po bid with meals today for mood lability/psychosis . Will increase to Geodon 60 mg PO bid with meals tomorrow . Discussed with Pharmacist Michelle NasutiElena - higher dose will be less stimulating for her.  Will continue Cogentin 0.5 mg po qhs  for EPS.  Will continue Ativan 1 mg po q6h prn for severe anxiety/agitation. Will continue Lamictal 25 mg po bid for mood lability.  Will discontinue Trazodone for lack of efficacy. Start Remeron 7.5 mg po qhs for sleep tonight. Reassess tomorrow. Will make available PRN medications as per agitation protocol. Will continue to monitor vitals ,medication compliance and treatment side effects while patient is here.  Will monitor for medical issues as well as call consult as needed.  Ordered labs as above- cbc - wnl except for wblc slightly elevated at 13, cmp - wnl, lipid panel, wnl, tsh - wnl, EKG for qtc wnl , pregnancy test - negative, PL - elevated at 116 - will need to be monitored - likely due to risperidone since she has a hx of the same. Was able to obtain collateral information from Dr.Agarwal as well as husband - pls see H&P. CSW will continue working on disposition. Collateral information was obtained from husband today per CSW - please see above. Patient to participate in therapeutic milieu.   Beau FannyWithrow, John C, FNP 09/26/2016, 2:19 PM  I agree with findings and treatment plan of this patient

## 2016-09-27 NOTE — Plan of Care (Addendum)
Problem: Activity: Goal: Interest or engagement in activities will improve Outcome: Not Progressing Pt isolates to room , pt will come out to use the phone

## 2016-09-27 NOTE — Progress Notes (Signed)
1:1  Note  Patient continues with 1:1 per MD order.  Respirations even and unlabored.  No signs/symptoms of pain/distress noted on patient's face/body movements.  Patient on hallway phone to husband this morning.  1:1 continues per MD order.

## 2016-09-27 NOTE — Progress Notes (Signed)
Patient has been reading while sitting on her bed.  Patient denied SI and HI.  Denied A/V hallucinations.  Denied pain.  Respirations even and unlabored.  No signs/symptoms of pain/distress noted on patient's face/body movements.  Safety maintained with 15 minute checks.

## 2016-09-27 NOTE — Progress Notes (Signed)
1:1 has been discontinued. Patient has been walking in hallway.  Respirations even and unlabored.  No signs/symptoms of pain/distress noted on patient's face/body movements.  Patient stated she is very stressed over not being able to take care of a larger house that her husband wants to purchase.  That she has to take approximately a 1 hr nap in the afternoon to keep her energy up to take care of their 33 yr old son and the home.  That husband works for [employer], and they are concerned about their green cards.  That their families are concerned they may have to return to [place].  Also their son is becoming accustomed to life in [place] and may have to return to [place].  Patient stated she will talk to staff who are always available to her.  Patient denied SI and HI, contracts for safety.  Denied A/V hallucinations.  Denied pain.  Safety maintained with 15 minute checks.

## 2016-09-27 NOTE — Plan of Care (Signed)
Problem: Coping: Goal: Ability to cope will improve Outcome: Progressing Nurse discussed depression/anxiety/coping skills with patient.    

## 2016-09-27 NOTE — Progress Notes (Signed)
D: Pt denies SI/HI/AVH. Pt is pleasant and cooperative. Pt stated she was doing better. Pt stated she would like to go back to work when she gets out, at least part time.   A: Pt was offered support and encouragement. Pt was given scheduled medications. Pt was encourage to attend groups. Q 15 minute checks were done for safety.   R:Pt is taking medication. Pt has no complaints.Pt receptive to treatment and safety maintained on unit.

## 2016-09-27 NOTE — Progress Notes (Signed)
Patient did go to dining room with MHT with her and her husband to visit her son.  Patient stated she was feeling better.  Patient denied SI and HI, no plan to hurt herself, contracts for safety.  Denied A/V hallucinations.  Denied pain.  Respirations even and unlabored.  No signs/symptoms of pain/distress noted on patient's face/body movements.  Safety maintained with 15 minute checks.

## 2016-09-27 NOTE — Progress Notes (Signed)
D:  Patient's self inventory sheet, patient has fair sleep, no sleep medication.  Fair appetite, normal energy level, good concentration.  Denied depression, anxiety and hopeless.  Denied withdrawals.  Denied SI.  Denied physical problems.  Denied pain.  A:  Medications administered per MD orders.  Emotional support and encouragement given patient. R:  Denied SI and HI, contracts for safety.  Denied A/V hallucinations.  Safety maintained with 15 minute checks.

## 2016-09-27 NOTE — Progress Notes (Addendum)
1:1 discontinued by MD. Patient has been in her room.  Respirations even and unlabored.  No signs/symptoms of pain/distress noted.  1:1 discontinued per MD order.

## 2016-09-27 NOTE — Progress Notes (Signed)
Patient did not attend afternoon group.  Patient has been resting in her room and taking a shower.  Patient has denied SI and HI.  Denied A/V hallucinations.  Respirations even and unlabored.  No signs/symptoms of pain/distress noted on patient's face/body movements.  Safety maintained with 15 minute checks.

## 2016-09-27 NOTE — Progress Notes (Signed)
[facility] MD Progress Note  09/27/2016 1:01 PM Jessica Steele  MRN:  [id number] Subjective:  Patient states "I feel OK."   Objective:Jessica Steele is a 35 y old [place] female , married , unemployed , lives with husband and 33 year old son in [place] , who has a hx of Bipolar disorder , presented to [facility] as a walk in for worsening mood sx, sleep issues and psychosis.  Pt seen and chart reviewed. Pt is alert/oriented x4, calm, cooperative, and appropriate to situation. Pt today is less tangential , less delusional , she is able to answer questions appropriately . Pt reports sleep as improved , pt denies any ADRs to medications.  Per staff - pt is more appropriate and redirectable - is compliant on medications.Although she continues to need PRN medications .  Spoke to Husband Mr.Palleri - who contacted Clinical research associate - discussed patient's progress. As per husband , its likely that patient is responding well to the medications and seems more calm now."   Principal Problem: Bipolar disorder, curr episode mixed, severe, with psychotic features (HCC) Diagnosis:   Patient Active Problem List   Diagnosis Date Noted  . Bipolar disorder, curr episode mixed, severe, with psychotic features (HCC) [F31.64] 09/20/2016   Total Time spent with patient: 25 minutes   Past Psychiatric History: Please see H&P.   Past Medical History:  Past Medical History:  Diagnosis Date  . Anxiety   . Bipolar disorder (HCC)   . Depression   . Fast heart beat    associated with anxiety  . PCOS (polycystic ovarian syndrome)   . Schizophrenia [facility])     Past Surgical History:  Procedure Laterality Date  . CESAREAN SECTION     Family History:  Family History  Problem Relation Age of Onset  . Alcohol abuse Neg Hx   . Anxiety disorder Neg Hx   . Bipolar disorder Neg Hx   . Drug abuse Neg Hx   . Depression Neg Hx   . Schizophrenia Neg Hx   . Suicidality Neg Hx    Family Psychiatric  History: Please see H&P.  Social  History: Please see H&P.  History  Alcohol Use No     History  Drug Use No    Social History   Social History  . Marital status: Married    Spouse name: N/A  . Number of children: 1  . Years of education: 11   Social History Main Topics  . Smoking status: Never Smoker  . Smokeless tobacco: Never Used     Comment: non smoker  . Alcohol use No  . Drug use: No  . Sexual activity: Yes    Birth control/ protection: None   Other Topics Concern  . None   Social History Narrative   Married and living with husband and 5yo son. 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 Grew up in [place]. Pt has a BS in engineering and denies problems in school.    Additional Social History:    Pain Medications: denied Prescriptions: risperdal   klonipin Over the Counter: tylenol History of alcohol / drug use?: No history of alcohol / drug abuse Longest period of sobriety (when/how long): never used alcohol, drugs, tobacco Negative Consequences of Use: Financial Withdrawal Symptoms: Other (Comment) (denied withdrawals)                    Sleep: improving  Appetite:  Fair  Current Medications: Current Facility-Administered Medications  Medication Dose Route Frequency Provider Last Rate Last Dose  . acetaminophen (TYLENOL) tablet 650 mg  650 mg Oral Q6H PRN Laveda Abbe, NP      . alum & mag hydroxide-simeth (MAALOX/MYLANTA) 200-200-20 MG/5ML suspension 30 mL  30 mL Oral Q4H PRN Laveda Abbe, NP      . benztropine (COGENTIN) tablet 1 mg  1 mg Oral QHS Beau Fanny, FNP   1 mg at 09/26/16 2110  . lamoTRIgine (LAMICTAL) tablet 25 mg  25 mg Oral BID Laveda Abbe, NP   25 mg at 09/27/16 0934  . lidocaine (LIDODERM) 5 % 1 patch  1 patch Transdermal Q24H Jomarie Longs, MD   1 patch at 09/27/16 1130  . LORazepam (ATIVAN) tablet 1 mg  1 mg Oral Q6H PRN Jomarie Longs, MD   1 mg at 09/26/16 2110   Or  . LORazepam (ATIVAN) injection 1 mg  1 mg  Intramuscular Q6H PRN Gedalia Mcmillon, MD      . magnesium hydroxide (MILK OF MAGNESIA) suspension 30 mL  30 mL Oral Daily PRN Laveda Abbe, NP   30 mL at 09/24/16 2020  . mirtazapine (REMERON) tablet 7.5 mg  7.5 mg Oral QHS Jomarie Longs, MD   7.5 mg at 09/26/16 2110  . OLANZapine (ZYPREXA) tablet 5 mg  5 mg Oral TID PRN Jomarie Longs, MD   5 mg at 09/24/16 0509   Or  . OLANZapine (ZYPREXA) injection 5 mg  5 mg Intramuscular TID PRN Jomarie Longs, MD      . ziprasidone (GEODON) capsule 60 mg  60 mg Oral BID WC Soniya Ashraf, MD   60 mg at 09/27/16 1610    Lab Results:  No results found for this or any previous visit (from the past 48 hour(s)).  Blood Alcohol level:  Lab Results  Component Value Date   [facility] <11 11/10/2011    Metabolic Disorder Labs: Lab Results  Component Value Date   HGBA1C 5.6 09/21/2016   MPG 114 09/21/2016   Lab Results  Component Value Date   PROLACTIN 116.6 (H) 09/21/2016   Lab Results  Component Value Date   CHOL 194 09/21/2016   TRIG 156 (H) 09/21/2016   HDL 37 (L) 09/21/2016   CHOLHDL 5.2 09/21/2016   VLDL 31 09/21/2016   LDLCALC 126 (H) 09/21/2016    Physical Findings: AIMS: Facial and Oral Movements Muscles of Facial Expression: None, normal Lips and Perioral Area: None, normal Jaw: None, normal Tongue: None, normal,Extremity Movements Upper (arms, wrists, hands, fingers): None, normal Lower (legs, knees, ankles, toes): None, normal, Trunk Movements Neck, shoulders, hips: None, normal, Overall Severity Severity of abnormal movements (highest score from questions above): None, normal Incapacitation due to abnormal movements: None, normal Patient's awareness of abnormal movements (rate only patient's report): No Awareness, Dental Status Current problems with teeth and/or dentures?: No Does patient usually wear dentures?: No  CIWA:  CIWA-Ar Total: 1 COWS:  COWS Total Score: 2  Musculoskeletal: Strength & Muscle Tone: within  normal limits Gait & Station: normal Patient leans: N/A  Psychiatric Specialty Exam: Physical Exam  Nursing note and vitals reviewed.   Review of Systems  Psychiatric/Behavioral: Positive for depression. Negative for hallucinations. The patient is nervous/anxious.   All other systems reviewed and are negative.   Blood pressure 123/80, pulse (!) 102, temperature 98.5 F (36.9 C), temperature source Oral, resp. rate 16, height 5\' 2"  (1.575 m), weight 75.8 kg (167 lb), last menstrual period 11/02/2011, SpO2  100 %.Body mass index is 30.54 kg/m.  General Appearance: Casual and Fairly Groomed  Eye Contact:  Good  Speech:  Normal rate, clear and coherent  Volume:  Normal  Mood:  Anxious  Affect: congruent   Thought Process:  Goal Directed and Descriptions of Associations: Circumstantial improving  Orientation:  Full (Time, Place, and Person)  Thought Content:  Logical improving   Suicidal Thoughts:  No   Homicidal Thoughts:  No  Memory:  Immediate;   Fair Recent;   Fair Remote;   Poor  Judgement:  Impaired  Insight:  Shallow  Psychomotor Activity:  Restlessness  Concentration:  Concentration: Fair and Attention Span: Fair  Recall:  FiservFair  Fund of Knowledge:  Fair  Language:  Fair  Akathisia:  No  Handed:  Right  AIMS (if indicated):     Assets:  Desire for Improvement  ADL's:  Intact  Cognition:  WNL  Sleep:  Number of Hours: 5   Treatment Plan Summary:Jessica Steele is a [age] y old [place] female , married , unemployed , lives [place] husband [place] 33 year old [place] , who has a hx of Bipolar disorder , [facility] for worsening mood sx, sleep issues and psychosis.  Patient continues to improve , hence will discontinue 1:1 precaution and monitor .  Bipolar disorder, curr episode mixed, severe, with psychotic features (HCC)    Daily contact with patient to assess and evaluate symptoms and progress in treatment and Medication management  Will continue  Geodon 60 mg PO bid with meals for mood sx.  Will continue Cogentin 0.5 mg po qhs  for EPS.  Will continue Ativan 1 mg po q6h prn for severe anxiety/agitation. Will continue Lamictal 25 mg po bid for mood lability.  Will continue Remeron 7.5 mg po qhs for sleep tonight. Will make available PRN medications as per agitation protocol. Will continue to monitor vitals ,medication compliance and treatment side effects while patient is here.  Will monitor for medical issues as well as call consult as needed.  Ordered labs as above- cbc - wnl except for wblc slightly elevated at 13, cmp - wnl, lipid panel, wnl, tsh - wnl, EKG for qtc wnl , pregnancy test - negative, PL - elevated at 116 - will need to be monitored - likely due to risperidone since she has a hx of the same. Was able to obtain collateral information from Dr.Agarwal as well as husband - pls see H&P. CSW will continue working on disposition. Collateral information was obtained from husband today per CSW - please see above. Patient to participate in therapeutic milieu.   Jessica Mosher, MD 09/27/2016, 1:01 PM

## 2016-09-27 NOTE — Progress Notes (Signed)
1:1 discontinued per MD: 1230  Patient ate approximately 85% of her lunch.  Went to dining room to choose her food and then returned to dayroom and ate with MHT.  Patient denied SI and HI, contracts for safety.  Denied A/V hallucinations.  Denied pain.  Patient stated she is happy to not be on 1:1 now but does enjoys talking to [facility] on hallway.  Respirations even and unlabored.  No signs/symptoms of pain/distress noted on patient's face/body movements. Safety maintained with 15 minute checks.

## 2016-09-27 NOTE — [facility] Group Notes (Signed)
[facility] LCSW Group Therapy  09/27/2016 1:15pm  Type of Therapy:  Group Therapy vercoming Obstacles  Pt did not attend, declined invitation.   Sabena Winner Carter, LCSWA 09/27/2016 2:22 PM  

## 2016-09-27 NOTE — Progress Notes (Signed)
Writer observed patient coming up to dayroom and had her vitals taken and fixed herself a cup of coffee and returned to her room. She asked writer if I drank coffee and we cleaned up her room a little. She reports that she feels ready to try being off her 1:1. Support given and safety maintained on unit with MHT at patients side. 1:1 continues.

## 2016-09-27 NOTE — Progress Notes (Addendum)
Patient went to dinner, came back to medication window and stated she has suicidal thoughts, no plan, contracts for safety.  Patient knows that her husband is coming to visit tonight.  Patient stated she is too depressed to see her son tonight.  Patient stated she is happy in her marriage.  Patient stated she is having stress issues herself.  No specific problem, everything in general is stressing her out.  Patient stated she misses being home with her husband and child.  Patient has been crying, standing at medication window.  Ativan given patient for anxiety/agitation.  Respirations even and unlabored.  Safety maintained with 15 minute checks. This nurse has informed the other nurse on the hall, MHT's, charge nurse.

## 2016-09-27 NOTE — Progress Notes (Signed)
1:1 Nursing note - Patient is currently lyin in bed asleep with eyes closed and respirations even and unlabored. No signs of distress. 1:1 continues and patient is safe.

## 2016-09-27 NOTE — Progress Notes (Signed)
Recreation Therapy Notes  Date: 09/27/16 Time: 1000 Location: 500 Hall Dayroom  Group Topic: Self-Esteem  Goal Area(s) Addresses:  Patient will identify positive ways to increase self-esteem. Patient will verbalize benefit of increased self-esteem.  Intervention: Worksheets, colored pencils  Activity: How I See Me.  LRT introduced the concept of self-esteem to the patients.  Patients were given worksheets with blank faces on them.  Patients were to draw or write how they see themselves on the face.  On the back of the mask, they were to describe how they feel others see them.  Education:  Self-Esteem, Building control surveyorDischarge Planning.   Education Outcome: Acknowledges education/In group clarification offered/Needs additional education  Clinical Observations/Feedback: Pt did not attend group.   Caroll RancherMarjette Eveline Sauve, LRT/CTRS     Lillia AbedLindsay, Jahid Weida A 09/27/2016 1:00 PM

## 2016-09-28 MED ORDER — LAMOTRIGINE 25 MG PO TABS
50.0000 mg | ORAL_TABLET | Freq: Every evening | ORAL | Status: DC
  Administered 2016-09-28 – 2016-09-30 (×3): 50 mg via ORAL
  Filled 2016-09-28 (×5): qty 2

## 2016-09-28 MED ORDER — LAMOTRIGINE 25 MG PO TABS
25.0000 mg | ORAL_TABLET | Freq: Every day | ORAL | Status: DC
  Administered 2016-09-29 – 2016-09-30 (×2): 25 mg via ORAL
  Filled 2016-09-28 (×4): qty 1

## 2016-09-28 NOTE — Plan of Care (Signed)
Problem: Education: Goal: Ability to state activities that reduce stress will improve Outcome: Progressing Nurse discussed anxiety/depression/coping skills with patient.    

## 2016-09-28 NOTE — [facility] Group Notes (Signed)
[facility] LCSW Group Therapy  09/28/2016 11:15 AM  Type of Therapy:  Group Therapy  Participation Level:  [facility] LCSW Group Therapy  Participation Quality:  Invited, did not attend; came in briefly at end of group and wanted coffee.  Did not seem aware she was interrupting ongoing group.  Modes of Intervention:  Discussion, Socialization and Support  Summary of Progress/Problems: The topic for group therapy was feelings about diagnosis.  Pt participated in group discussion on their past and current diagnosis and how they feel towards this.  Pt also identified how society and family members judge them, based on their diagnosis as well as stereotypes and stigmas.    Sallee Langenne C Neoma Uhrich 09/28/2016, 4:34 PM

## 2016-09-28 NOTE — [facility] Group Notes (Signed)
The focus of this group is to educate the patient on the purpose and policies of crisis stabilization and provide a format to answer questions about their admission.  The group details unit policies and expectations of patients while admitted.  Patient did not attend 0900 nurse education orientation group this morning.  Patient stayed in her room. 

## 2016-09-28 NOTE — Progress Notes (Signed)
Did  Not attend group 

## 2016-09-28 NOTE — Progress Notes (Signed)
D:  Patient's self inventory sheet, patient sleeps good, no sleep medication given.  Good appetite, high energy level, good concentration.  Rated depression and hopeless 3, anxiety 5.  Withdrawals, chilling, irritability.  Denied SI, then marked sometimes.  Physical problems, dizziness, headaches.  Denied pain.  Goal is to go home.  Plans to sleep well.  "Thank you so much for the love, affection and food."  No discharge plans. A:  Medications administered per MD orders.  Emotional support and encouragement given patient. R:  Denied SI and HI while talking to nurse, contracts for safety.  Denied A/V hallucinations.  Safety maintained with 15 minute checks.

## 2016-09-28 NOTE — Progress Notes (Signed)
[facility]  09/28/2016 10:31 AM Lauro FranklinDeepa Space  MRN:  [id number] Subjective:  Patient states "I feel fine. The visit did not go so well last night since I did not feel good about myself."   Objective:Magen Candace Cruisenandan is a [age] y old [place] female , married , unemployed , lives with husband and 33 year old [place] in [place] , who has a hx of Bipolar disorder , [facility] for worsening mood sx, sleep issues and psychosis.  Pt seen and chart reviewed. Pt is alert/oriented x4, calm, cooperative, and appropriate to situation. Pt today is more goal directed , her thought process is more linear  , she is able to answer questions appropriately . She continues to report some anxiety/agitation that is periodic and also continues to have tearfulness on and off . Pt reports sleep as improved , pt denies any ADRs to medications.  Per staff - pt is more appropriate and redirectable - is compliant on medications. However , during visiting hrs last night she was tearful and anxious and required support, she continues to need PRN medications on and off  .   Principal Problem: Bipolar disorder, curr episode mixed, severe, with psychotic features (HCC) Diagnosis:   Patient Active Problem List   Diagnosis Date Noted  . Bipolar disorder, curr episode mixed, severe, with psychotic features (HCC) [F31.64] 09/20/2016   Total Time spent with patient: 25 minutes   Past Psychiatric History: Please see H&P.   Past Medical History:  Past Medical History:  Diagnosis Date  . Anxiety   . Bipolar disorder (HCC)   . Depression   . Fast heart beat    associated with anxiety  . PCOS (polycystic ovarian syndrome)   . Schizophrenia [facility](HCC)     Past Surgical History:  Procedure Laterality Date  . CESAREAN SECTION     Family History:  Family History  Problem Relation Age of Onset  . Alcohol abuse Neg Hx   . Anxiety disorder Neg Hx   . Bipolar disorder Neg Hx   . Drug abuse Neg Hx   .  Depression Neg Hx   . Schizophrenia Neg Hx   . Suicidality Neg Hx    Family Psychiatric  History: Please see H&P.  Social History: Please see H&P.  History  Alcohol Use No     History  Drug Use No    Social History   Social History  . Marital status: Married    Spouse name: N/A  . Number of children: 1  . Years of education: 8216   Social History Main Topics  . Smoking status: Never Smoker  . Smokeless tobacco: Never Used     Comment: non smoker  . Alcohol use No  . Drug use: No  . Sexual activity: Yes    Birth control/ protection: None   Other Topics Concern  . None   Social History Narrative   Married and living with husband and 5yo son. 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 Grew up in [place]. Pt has a BS in engineering and denies problems in school.    Additional Social History:    Pain Medications: denied Prescriptions: risperdal   klonipin Over the Counter: tylenol History of alcohol / drug use?: No history of alcohol / drug abuse Longest period of sobriety (when/how long): never used alcohol, drugs, tobacco Negative Consequences of Use: Financial Withdrawal Symptoms: Other (Comment) (denied withdrawals)  Sleep: improving  Appetite:  Fair  Current Medications: Current Facility-Administered Medications  Medication Dose Route Frequency Provider Last Rate Last Dose  . acetaminophen (TYLENOL) tablet 650 mg  650 mg Oral Q6H PRN Laveda Abbe, NP      . alum & mag hydroxide-simeth (MAALOX/MYLANTA) 200-200-20 MG/5ML suspension 30 mL  30 mL Oral Q4H PRN Laveda Abbe, NP      . benztropine (COGENTIN) tablet 1 mg  1 mg Oral QHS Beau Fanny, FNP   1 mg at 09/27/16 2048  . [START ON 09/29/2016] lamoTRIgine (LAMICTAL) tablet 25 mg  25 mg Oral Daily Joclyn Alsobrook, MD      . lamoTRIgine (LAMICTAL) tablet 50 mg  50 mg Oral QPM Beola Vasallo, MD      . lidocaine (LIDODERM) 5 % 1 patch  1 patch  Transdermal Q24H Jomarie Longs, MD   1 patch at 09/27/16 1130  . LORazepam (ATIVAN) tablet 1 mg  1 mg Oral Q6H PRN Jomarie Longs, MD   1 mg at 09/27/16 1811   Or  . LORazepam (ATIVAN) injection 1 mg  1 mg Intramuscular Q6H PRN Carmelina Balducci, MD      . magnesium hydroxide (MILK OF MAGNESIA) suspension 30 mL  30 mL Oral Daily PRN Laveda Abbe, NP   30 mL at 09/24/16 2020  . mirtazapine (REMERON) tablet 7.5 mg  7.5 mg Oral QHS Jomarie Longs, MD   7.5 mg at 09/27/16 2049  . OLANZapine (ZYPREXA) tablet 5 mg  5 mg Oral TID PRN Jomarie Longs, MD   5 mg at 09/24/16 0509   Or  . OLANZapine (ZYPREXA) injection 5 mg  5 mg Intramuscular TID PRN Jomarie Longs, MD      . ziprasidone (GEODON) capsule 60 mg  60 mg Oral BID WC Shelma Eiben, MD   60 mg at 09/28/16 0744    Lab Results:  No results found for this or any previous visit (from the past 48 hour(s)).  Blood Alcohol level:  Lab Results  Component Value Date   [facility] <11 11/10/2011    Metabolic Disorder Labs: Lab Results  Component Value Date   HGBA1C 5.6 09/21/2016   MPG 114 09/21/2016   Lab Results  Component Value Date   PROLACTIN 116.6 (H) 09/21/2016   Lab Results  Component Value Date   CHOL 194 09/21/2016   TRIG 156 (H) 09/21/2016   HDL 37 (L) 09/21/2016   CHOLHDL 5.2 09/21/2016   VLDL 31 09/21/2016   LDLCALC 126 (H) 09/21/2016    Physical Findings: AIMS: Facial and Oral Movements Muscles of Facial Expression: None, normal Lips and Perioral Area: None, normal Jaw: None, normal Tongue: None, normal,Extremity Movements Upper (arms, wrists, hands, fingers): None, normal Lower (legs, knees, ankles, toes): None, normal, Trunk Movements Neck, shoulders, hips: None, normal, Overall Severity Severity of abnormal movements (highest score from questions above): None, normal Incapacitation due to abnormal movements: None, normal Patient's awareness of abnormal movements (rate only patient's report): No Awareness,  Dental Status Current problems with teeth and/or dentures?: No Does patient usually wear dentures?: No  CIWA:  CIWA-Ar Total: 1 COWS:  COWS Total Score: 2  Musculoskeletal: Strength & Muscle Tone: within normal limits Gait & Station: normal Patient leans: N/A  Psychiatric Specialty Exam: Physical Exam  Nursing note and vitals reviewed.   Review of Systems  Psychiatric/Behavioral: Positive for depression. Negative for hallucinations. The patient is nervous/anxious.   All other systems reviewed and are negative.   Blood  pressure 117/82, pulse (!) 106, temperature 98.7 F (37.1 C), temperature source Oral, resp. rate 18, height 5\' 2"  (1.575 m), weight 75.8 kg (167 lb), last menstrual period 11/02/2011, SpO2 100 %.Body mass index is 30.54 kg/m.  General Appearance: Casual and Fairly Groomed  Eye Contact:  Good  Speech:  Normal rate, clear and coherent  Volume:  Normal  Mood:  Anxious  Affect: congruent   Thought Process:  Goal Directed and Descriptions of Associations: Circumstantial improving  Orientation:  Full (Time, Place, and Person)  Thought Content:  Logical improving   Suicidal Thoughts:  No   Homicidal Thoughts:  No  Memory:  Immediate;   Fair Recent;   Fair Remote;   Poor  Judgement:  Impaired  Insight:  Shallow  Psychomotor Activity:  Restlessness  Concentration:  Concentration: Fair and Attention Span: Fair  Recall:  [employer] of Knowledge:  Fair  Language:  Fair  Akathisia:  No  Handed:  Right  AIMS (if indicated):     Assets:  Desire for Improvement  ADL's:  Intact  Cognition:  WNL  Sleep:  Number of Hours: 6   09/27/16 Spoke to Husband Mr.Palleri - who contacted Clinical research associate - discussed patient's progress. As per husband , its likely that patient is responding well to the medications and seems more calm now."     Treatment Plan Summary:Ashe Abid is a 25 y old [place] female , married , unemployed , lives with husband and 56 year old son in [place] ,  who has a hx of Bipolar disorder , presented to [facility] as a walk in for worsening mood sx, sleep issues. Patient continues to improve , although has periodic anxiety, agitation, requires PRN ativan on and off. Will continue treatment.  Bipolar disorder, curr episode mixed, severe, with psychotic features (HCC)    Daily contact with patient to assess and evaluate symptoms and progress in treatment and Medication management  Will continue Geodon 60 mg PO bid with meals for mood sx.  Will continue Cogentin 0.5 mg po qhs  for EPS.  Will continue Ativan 1 mg po q6h prn for severe anxiety/agitation. Will increase Lamictal to 25 mg po daily and 50 mg po qpm  for mood lability.  Will continue Remeron 7.5 mg po qhs for sleep tonight. Will make available PRN medications as per agitation protocol. Will continue to monitor vitals ,medication compliance and treatment side effects while patient is here.  Will monitor for medical issues as well as call consult as needed.  Ordered labs as above- cbc - wnl except for wblc slightly elevated at 13, cmp - wnl, lipid panel, wnl, tsh - wnl, EKG for qtc wnl , pregnancy test - negative, PL - elevated at 116 - will need to be monitored - likely due to risperidone since she has a hx of the same. Was able to obtain collateral information from Dr.Agarwal as well as husband - pls see H&P. CSW will continue working on disposition. Collateral information was obtained from husband today per CSW - please see above. Patient to participate in therapeutic milieu.   Jasmin Winberry, MD 09/28/2016, 10:31 AM

## 2016-09-28 NOTE — Tx Team (Signed)
Interdisciplinary Treatment and Diagnostic Plan Update  09/28/2016 Time of Session: 11:02 AM  Jessica FranklinDeepa Steele MRN: [id number]  Principal Diagnosis: Bipolar disorder, curr episode mixed, severe, with psychotic features (HCC)  Secondary Diagnoses: Principal Problem:   Bipolar disorder, curr episode mixed, severe, with psychotic features (HCC)   Current Medications:  Current Facility-Administered Medications  Medication Dose Route Frequency Provider Last Rate Last Dose  . acetaminophen (TYLENOL) tablet 650 mg  650 mg Oral Q6H PRN Laveda AbbeLaurie Britton Parks, NP      . alum & mag hydroxide-simeth (MAALOX/MYLANTA) 200-200-20 MG/5ML suspension 30 mL  30 mL Oral Q4H PRN Laveda AbbeLaurie Britton Parks, NP      . benztropine (COGENTIN) tablet 1 mg  1 mg Oral QHS Beau FannyJohn C Withrow, FNP   1 mg at 09/27/16 2048  . [START ON 09/29/2016] lamoTRIgine (LAMICTAL) tablet 25 mg  25 mg Oral Daily Saramma Eappen, MD      . lamoTRIgine (LAMICTAL) tablet 50 mg  50 mg Oral QPM Saramma Eappen, MD      . lidocaine (LIDODERM) 5 % 1 patch  1 patch Transdermal Q24H Jomarie LongsSaramma Eappen, MD   1 patch at 09/27/16 1130  . LORazepam (ATIVAN) tablet 1 mg  1 mg Oral Q6H PRN Jomarie LongsSaramma Eappen, MD   1 mg at 09/27/16 1811   Or  . LORazepam (ATIVAN) injection 1 mg  1 mg Intramuscular Q6H PRN Saramma Eappen, MD      . magnesium hydroxide (MILK OF MAGNESIA) suspension 30 mL  30 mL Oral Daily PRN Laveda AbbeLaurie Britton Parks, NP   30 mL at 09/24/16 2020  . mirtazapine (REMERON) tablet 7.5 mg  7.5 mg Oral QHS Jomarie LongsSaramma Eappen, MD   7.5 mg at 09/27/16 2049  . OLANZapine (ZYPREXA) tablet 5 mg  5 mg Oral TID PRN Jomarie LongsSaramma Eappen, MD   5 mg at 09/24/16 0509   Or  . OLANZapine (ZYPREXA) injection 5 mg  5 mg Intramuscular TID PRN Jomarie LongsSaramma Eappen, MD      . ziprasidone (GEODON) capsule 60 mg  60 mg Oral BID WC Jomarie LongsSaramma Eappen, MD   60 mg at 09/28/16 0744    PTA Medications: Prescriptions Prior to Admission  Medication Sig Dispense Refill Last Dose  . clonazePAM (KLONOPIN) 1  MG tablet Take 1 mg by mouth 2 (two) times daily as needed for anxiety.   09/18/2016 at Unknown time  . diphenhydrAMINE (BENADRYL) 25 MG tablet Take 1 tablet (25 mg total) by mouth at bedtime as needed for sleep. 30 tablet 0 Past Week at Unknown time  . risperiDONE (RISPERDAL) 1 MG tablet Take 1 tablet (1 mg total) by mouth at bedtime. 30 tablet 2 09/18/2016 at Unknown time    Treatment Modalities: Medication Management, Group therapy, Case management,  1 to 1 session with clinician, Psychoeducation, Recreational therapy.   Physician Treatment Plan for Primary Diagnosis: Bipolar disorder, curr episode mixed, severe, with psychotic features (HCC) Long Term Goal(s): Improvement in symptoms so as ready for discharge  Short Term Goals: Ability to identify changes in lifestyle to reduce recurrence of condition will improve  Medication Management: Evaluate patient's response, side effects, and tolerance of medication regimen.  Therapeutic Interventions: 1 to 1 sessions, Unit Group sessions and Medication administration.  Evaluation of Outcomes: Progressing  Physician Treatment Plan for Secondary Diagnosis: Principal Problem:   Bipolar disorder, curr episode mixed, severe, with psychotic features (HCC)   Long Term Goal(s): Improvement in symptoms so as ready for discharge  Short Term Goals: Ability to verbalize feelings  will improve  Medication Management: Evaluate patient's response, side effects, and tolerance of medication regimen.  Therapeutic Interventions: 1 to 1 sessions, Unit Group sessions and Medication administration.  Evaluation of Outcomes: Progressing  09/23/16:Will cross titrate  Risperidone with Geodon. Will start Geodon at 40 mg po bid with meals today for mood lability/psychosis . Will increase to Geodon 60 mg PO bid with meals tomorrow . Discussed with Pharmacist Michelle Nasuti - higher dose will be less stimulating for her . Will continue Cogentin 0.5 mg po qhs for EPS. Will  continue Ativan 1 mg po q6h prn for severe anxiety/agitation. Will continue Lamictal 25 mg po bid for mood lability. Will discontinue Trazodone for lack of efficacy. Start Remeron 7.5 mg po qhs for sleep tonight. Reassess tomorrow.   RN Treatment Plan for Primary Diagnosis: Bipolar disorder, curr episode mixed, severe, with psychotic features (HCC) Long Term Goal(s): Knowledge of disease and therapeutic regimen to maintain health will improve  Short Term Goals: Ability to verbalize frustration and anger appropriately will improve and Ability to demonstrate self-control  Medication Management: RN will administer medications as ordered by provider, will assess and evaluate patient's response and provide education to patient for prescribed medication. RN will report any adverse and/or side effects to prescribing provider.  Therapeutic Interventions: 1 on 1 counseling sessions, Psychoeducation, Medication administration, Evaluate responses to treatment, Monitor vital signs and CBGs as ordered, Perform/monitor CIWA, COWS, AIMS and Fall Risk screenings as ordered, Perform wound care treatments as ordered.  Evaluation of Outcomes: Progressing   LCSW Treatment Plan for Primary Diagnosis: Bipolar disorder, curr episode mixed, severe, with psychotic features (HCC) Long Term Goal(s): Safe transition to appropriate next level of care at discharge, Engage patient in therapeutic group addressing interpersonal concerns.  Short Term Goals: Engage patient in aftercare planning with referrals and resources  Therapeutic Interventions: Assess for all discharge needs, 1 to 1 time with Social worker, Explore available resources and support systems, Assess for adequacy in community support network, Educate family and significant other(s) on suicide prevention, Complete Psychosocial Assessment, Interpersonal group therapy.  Evaluation of Outcomes: Progressing     Progress in Treatment: Attending groups:  Occasionally Participating in groups: Miminal Taking medication as prescribed: Yes Toleration medication: Yes, no side effects reported at this time Family/Significant other contact made: Yes, with husband Patient understands diagnosis: No  Limited insight Discussing patient identified problems/goals with staff: Yes Medical problems stabilized or resolved: Yes Denies suicidal/homicidal ideation: Yes Issues/concerns per patient self-inventory: None Other: N/A  New problem(s) identified: None identified at this time.   New Short Term/Long Term Goal(s): None identified at this time.   Discharge Plan or Barriers: return home, follow up outpt  Reason for Continuation of Hospitalization: Disorganization Mania Medication stabilization    Estimated Length of Stay: 3-5 days  Attendees: Patient: 09/28/2016  11:02 AM  Physician: Jomarie Longs, MD 09/28/2016  11:02 AM  Nursing: Meriam Sprague RN; Erskine Squibb RN 09/28/2016  11:02 AM  RN Care Manager: 09/28/2016  11:02 AM  Social Worker: Governor Rooks LCSW 09/28/2016  11:02 AM  Recreational Therapist: Aggie Cosier LRT 09/28/2016  11:02 AM  Other: Tomasita Morrow P4CC 09/28/2016  11:02 AM  Other:  09/28/2016  11:02 AM    Scribe for Treatment Team:  Santa Genera LCSW 09/28/2016 11:02 AM

## 2016-09-28 NOTE — Progress Notes (Signed)
Recreation Therapy Notes  Date: 09/28/16 Time: 1000 Location: 500 Hall Dayroom  Group Topic: Wellness  Goal Area(s) Addresses:  Patient will define components of whole wellness. Patient will verbalize benefit of whole wellness.  Intervention: Beach ball, chairs  Activity: Keep it Going Volleyball.  Patients were arranged in a circle.  Patients were to pass to ball to each other while the LRT counted the number of hits on the ball.  The ball was allowed to bounce off of the floor but could not roll to a stop.  If the ball came to a stop, the count would start over.  Education: Wellness, Discharge Planning.   Education Outcome: Acknowledges education/In group clarification offered/Needs additional education.   Clinical Observations/Feedback: Pt did not attend group.    Holt Woolbright, LRT/CTRS         Varetta Chavers A 09/28/2016 11:49 AM 

## 2016-09-28 NOTE — Progress Notes (Signed)
Patient's husband called and asked about patient's status for discharge.

## 2016-09-29 NOTE — Progress Notes (Signed)
Recreation Therapy Notes  Date: 09/29/16 Time: 1000 Location: 500 Hall Dayroom  Group Topic: Anger Management  Goal Area(s) Addresses:  Patient will identify triggers for anger.  Patient will identify physical reaction to anger.   Patient will identify benefit of using coping skills when angry.  Behavioral Response:  Engaged  Intervention: Worksheet, pencils  Activity: Anger Thermometer.  Patients were given a worksheet with a thermometer numbered from 1-10.  Patients were to identify triggers that get them angry and place them on the thermometer (1-being the lowest, 10-being the highest).  Patients were to then come up with coping skills they can use to combat their anger.  Education: Anger Management, Discharge Planning   Education Outcome: Acknowledges education/In group clarification offered/Needs additional education.   Clinical Observations/Feedback: Pt stated when she gets angry, she wrinkles up her face.  Pt identified some of her anger triggers as fear, harrassment, hunger and abuse.  Pt stated she deals with these things by praying to God and keep someone informed about what's going on with her.  Pt expressed that talking to someone helps her to release stress.   Caroll RancherMarjette Messina Kosinski, LRT/CTRS      Caroll RancherLindsay, Kristyanna Barcelo A 09/29/2016 11:41 AM

## 2016-09-29 NOTE — Progress Notes (Signed)
   D: Pt smiled as she discussed the possibility of being discharged on Friday. When asked about her day pt smiled and stated, "it wasn't that good". Pt stated she misses her husband and child.  After discharge pt plans to do IOP, but states she still needs to speak to her Dr again tomorrow. Pt has no questions or concerns.    A:  Support and encouragement was offered. 15 min checks continued for safety.  R: Pt remains safe.

## 2016-09-29 NOTE — [facility] Group Notes (Signed)
[facility]   09/29/2016 9:21 AM  Participation Quality:  Invited, chose not to attend  Sallee LangeAnne C Tiah Heckel

## 2016-09-29 NOTE — Progress Notes (Signed)
  D: Pt informed the writer that she "misses her husband and child". Stated that she "wants to go be with her husband". Writer noticed that pt had a "full beard". Asked pt if she's like to shave. Pt stated, "yes, yes please". Writer instructed mht to allow pt to shave. Pt has no other questions or concerns.   A:  Support and encouragement was offered. 15 min checks continued for safety.  R: Pt remains safe.

## 2016-09-29 NOTE — Progress Notes (Signed)
Nursing Note 09/29/2016 [id number]  Data Reports sleeping well.  Did not complete self-inventory sheet.  Affect wide ranged and appropriate.  Denies HI, SI, AVH.  No intrusive or irritable behavior observed.  States she has been feeling better lately and attributes it to the medicine.  Smiling this AM and stating she was doing well, approached nurse in afternoon saying she was feeling down "I think it's the TV.  Whenever I watch the TV I feel like it is me in the TV."   After TV turned off before group patient reported feeling better.  This evening reporting racing thoughts.  Action Spoke with patient 1:1, nurse offered support to patient throughout shift.  Support given to patient regarding her experience with the TV- encouraged to stay out of day area when TV is on if she is feeling triggered by it.  Offered to turn TV off in day area when nobody is watching it.  Taught patient deep breathing technique for racing thoughts.  Continues to be monitored on 15 minute checks for safety.  Response Remains safe on unit.  Patient reports improvement in racing thoughts with deep breathing.

## 2016-09-29 NOTE — Progress Notes (Signed)
Adult Psychoeducational Group Note  Date:  09/29/2016 Time:  10:37 PM  Group Topic/Focus:  Wrap-Up Group:   The focus of this group is to help patients review their daily goal of treatment and discuss progress on daily workbooks.   Participation Level:  Minimal  Participation Quality:  Appropriate  Affect:  Appropriate  Cognitive:  Oriented  Insight: Appropriate  Engagement in Group:  Engaged  Modes of Intervention:  Socialization and Support  Additional Comments:  Patient attended and participated in group tonight. She reports having racing thoughts earlier during the day. She took some medication for it and felt better. Today her husband and som visited with her and the visit was good.  Lita MainsFrancis, [facility] 09/29/2016, 10:37 PM

## 2016-09-29 NOTE — Progress Notes (Signed)
[facility] MD Progress Note  09/29/2016 12:57 PM Cailah Reach  MRN:  [id number] Subjective:  Patient states "I feel ok today.'   Objective:Rayen Dossantos is a 35 y old Asian [place] female , married , unemployed , lives with husband and 33 year old son in [place] , who has a hx of Bipolar disorder , presented to [facility] as a walk in for worsening mood sx, sleep issues and psychosis.  Pt seen and chart reviewed. Pt is alert/oriented x4, calm, cooperative, and appropriate to situation. Pt today seen as organized ,less tangential than she were two days ago, does appear tearful at times , however is more in control of her emotions . Pt is tolerating medications well , denies ADRs.  Per staff - pt is more appropriate and redirectable - is compliant on medications.   Spoke to Mr.Palleri - spouse - provided update about patient's progress. Discussed medications , as well as potential discharge date on Friday if she continues to improve.  Principal Problem: Bipolar disorder, curr episode mixed, severe, with psychotic features (HCC) Diagnosis:   Patient Active Problem List   Diagnosis Date Noted  . Bipolar disorder, curr episode mixed, severe, with psychotic features (HCC) [F31.64] 09/20/2016   Total Time spent with patient: 25 minutes   Past Psychiatric History: Please see H&P.   Past Medical History:  Past Medical History:  Diagnosis Date  . Anxiety   . Bipolar disorder (HCC)   . Depression   . Fast heart beat    associated with anxiety  . PCOS (polycystic ovarian syndrome)   . Schizophrenia [facility])     Past Surgical History:  Procedure Laterality Date  . CESAREAN SECTION     Family History:  Family History  Problem Relation Age of Onset  . Alcohol abuse Neg Hx   . Anxiety disorder Neg Hx   . Bipolar disorder Neg Hx   . Drug abuse Neg Hx   . Depression Neg Hx   . Schizophrenia Neg Hx   . Suicidality Neg Hx    Family Psychiatric  History: Please see H&P.  Social History: Please see  H&P.  History  Alcohol Use No     History  Drug Use No    Social History   Social History  . Marital status: Married    Spouse name: N/A  . Number of children: 1  . Years of education: 6   Social History Main Topics  . Smoking status: Never Smoker  . Smokeless tobacco: Never Used     Comment: non smoker  . Alcohol use No  . Drug use: No  . Sexual activity: Yes    Birth control/ protection: None   Other Topics Concern  . None   Social History Narrative   Married and living with husband and 5yo son. 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 Grew up in [place]. Pt has a BS in engineering and denies problems in school.    Additional Social History:    Pain Medications: denied Prescriptions: risperdal   klonipin Over the Counter: tylenol History of alcohol / drug use?: No history of alcohol / drug abuse Longest period of sobriety (when/how long): never used alcohol, drugs, tobacco Negative Consequences of Use: Financial Withdrawal Symptoms: Other (Comment) (denied withdrawals)                    Sleep: Fair  Appetite:  Fair  Current Medications: Current Facility-Administered Medications  Medication Dose  Route Frequency Provider Last Rate Last Dose  . acetaminophen (TYLENOL) tablet 650 mg  650 mg Oral Q6H PRN Laveda AbbeLaurie Britton Parks, NP      . alum & mag hydroxide-simeth (MAALOX/MYLANTA) 200-200-20 MG/5ML suspension 30 mL  30 mL Oral Q4H PRN Laveda AbbeLaurie Britton Parks, NP      . benztropine (COGENTIN) tablet 1 mg  1 mg Oral QHS Beau FannyJohn C Withrow, FNP   1 mg at 09/28/16 2110  . lamoTRIgine (LAMICTAL) tablet 25 mg  25 mg Oral Daily Jomarie LongsSaramma Aneudy Champlain, MD   25 mg at 09/29/16 [id number]  . lamoTRIgine (LAMICTAL) tablet 50 mg  50 mg Oral QPM Ligaya Cormier, MD   50 mg at 09/28/16 1702  . lidocaine (LIDODERM) 5 % 1 patch  1 patch Transdermal Q24H Jomarie LongsSaramma Abdulrahim Siddiqi, MD   1 patch at 09/29/16 1159  . LORazepam (ATIVAN) tablet 1 mg  1 mg Oral Q6H PRN Jomarie LongsSaramma Jeanluc Wegman, MD   1 mg  at 09/27/16 1811   Or  . LORazepam (ATIVAN) injection 1 mg  1 mg Intramuscular Q6H PRN Pasty Manninen, MD      . magnesium hydroxide (MILK OF MAGNESIA) suspension 30 mL  30 mL Oral Daily PRN Laveda AbbeLaurie Britton Parks, NP   30 mL at 09/24/16 2020  . mirtazapine (REMERON) tablet 7.5 mg  7.5 mg Oral QHS Jomarie LongsSaramma Tila Millirons, MD   7.5 mg at 09/28/16 2110  . OLANZapine (ZYPREXA) tablet 5 mg  5 mg Oral TID PRN Jomarie LongsSaramma Stanley Lyness, MD   5 mg at 09/24/16 0509   Or  . OLANZapine (ZYPREXA) injection 5 mg  5 mg Intramuscular TID PRN Jomarie LongsSaramma Tyler Cubit, MD      . ziprasidone (GEODON) capsule 60 mg  60 mg Oral BID WC Rumaisa Schnetzer, MD   60 mg at 09/29/16 [id number]    Lab Results:  No results found for this or any previous visit (from the past 48 hour(s)).  Blood Alcohol level:  Lab Results  Component Value Date   [facility]    Metabolic Disorder Labs: Lab Results  Component Value Date   HGBA1C 5.6 09/21/2016   MPG 114 09/21/2016   Lab Results  Component Value Date   PROLACTIN 116.6 (H) 09/21/2016   Lab Results  Component Value Date   CHOL 194 09/21/2016   TRIG 156 (H) 09/21/2016   HDL 37 (L) 09/21/2016   CHOLHDL 5.2 09/21/2016   VLDL 31 09/21/2016   LDLCALC 126 (H) 09/21/2016    Physical Findings: AIMS: Facial and Oral Movements Muscles of Facial Expression: None, normal Lips and Perioral Area: None, normal Jaw: None, normal Tongue: None, normal,Extremity Movements Upper (arms, wrists, hands, fingers): None, normal Lower (legs, knees, ankles, toes): None, normal, Trunk Movements Neck, shoulders, hips: None, normal, Overall Severity Severity of abnormal movements (highest score from questions above): None, normal Incapacitation due to abnormal movements: None, normal Patient's awareness of abnormal movements (rate only patient's report): No Awareness, Dental Status Current problems with teeth and/or dentures?: No Does patient usually wear dentures?: No  CIWA:  CIWA-Ar Total: 1 COWS:  COWS  Total Score: 2  Musculoskeletal: Strength & Muscle Tone: within normal limits Gait & Station: normal Patient leans: N/A  Psychiatric Specialty Exam: Physical Exam  Nursing note and vitals reviewed.   Review of Systems  Psychiatric/Behavioral: Positive for depression. Negative for hallucinations. The patient is nervous/anxious.   All other systems reviewed and are negative.   Blood pressure 106/66, pulse 94, temperature 98.3 F (36.8 C), temperature source Oral,  resp. rate 16, height 5\' 2"  (1.575 m), weight 75.8 kg (167 lb), last menstrual period 11/02/2011, SpO2 100 %.Body mass index is 30.54 kg/m.  General Appearance: Casual and Fairly Groomed  Eye Contact:  Good  Speech:  Normal rate, clear and coherent  Volume:  Normal  Mood:  Anxious improving  Affect: congruent   Thought Process:  Goal Directed and Descriptions of Associations: Circumstantial improving  Orientation:  Full (Time, Place, and Person)  Thought Content:  Logical improving   Suicidal Thoughts:  No   Homicidal Thoughts:  No  Memory:  Immediate;   Fair Recent;   Fair Remote;   Poor  Judgement:  Impaired  Insight:  Shallow  Psychomotor Activity:  Restlessness  Concentration:  Concentration: Fair and Attention Span: Fair  Recall:  [employer] of Knowledge:  Fair  Language:  Fair  Akathisia:  No  Handed:  Right  AIMS (if indicated):     Assets:  Desire for Improvement  ADL's:  Intact  Cognition:  WNL  Sleep:  Number of Hours: 6   09/27/16 Spoke to Husband Mr.Palleri - who contacted Clinical research associate - discussed patient's progress. As per husband , its likely that patient is responding well to the medications and seems more calm now."     Treatment Plan Summary:Sotiria Boshers is a 21 y old [place] female , married , unemployed , lives with husband and 8 year old son in [place] , who has a hx of Bipolar disorder , presented to [facility] as a walk in for worsening mood sx, sleep issues. Patient continues to improve ,  although has periodic anxiety, and tearfulness . Will continue treatment.  Bipolar disorder, curr episode mixed, severe, with psychotic features (HCC)    Daily contact with patient to assess and evaluate symptoms and progress in treatment and Medication management  Will continue Geodon 60 mg PO bid with meals for mood sx.  Will continue Cogentin 0.5 mg po qhs  for EPS.  Will continue Ativan 1 mg po q6h prn for severe anxiety/agitation. Increased Lamictal to 25 mg po daily and 50 mg po qpm  for mood lability.  Will continue Remeron 7.5 mg po qhs for sleep tonight. Will make available PRN medications as per agitation protocol. Will continue to monitor vitals ,medication compliance and treatment side effects while patient is here.  Will monitor for medical issues as well as call consult as needed.  Ordered labs as above- cbc - wnl except for wblc slightly elevated at 13, cmp - wnl, lipid panel, wnl, tsh - wnl, EKG for qtc wnl , pregnancy test - negative, PL - elevated at 116 - will need to be monitored - likely due to risperidone since she has a hx of the same. Was able to obtain collateral information from Dr.Agarwal as well as husband - pls see H&P. CSW will continue working on disposition. Collateral information was obtained from husband today per CSW - please see above. Patient to participate in therapeutic milieu.   Anya Murphey, MD 09/29/2016, 12:57 PM

## 2016-09-29 NOTE — [facility] Group Notes (Signed)
[facility] Note   09/29/2016 11:33 AM  Participation Quality:  Invited, chose not to attend   Mood/Affect:  Appropriate   Plan for Discharge/Comments:  Return home, follow up outpatient   Transportation Means: Unknown   Supports: CSW will assess.   Baldo DaubJolan Berlinda Farve

## 2016-09-29 NOTE — [facility] Group Notes (Signed)
[facility] LCSW Group Therapy  09/29/2016 2:27 PM   Type of Therapy:  Group Therapy   Participation Level:  Engaged  Participation Quality:  Attentive  Affect:  Appropriate   Cognitive:  Alert   Insight:  Engaged  Engagement in Therapy:  Improving   Modes of Intervention:  Education, Exploration, Socialization   Summary of Progress/Problems: Engaged throughout her stay, left group session 30 minutes in.   Jessica Steele from [employer] was here to tell his story of recovery, inform patients about [facility] and play his guitar.   Jessica Steele 09/29/2016 2:27 PM

## 2016-09-30 ENCOUNTER — Ambulatory Visit (HOSPITAL_COMMUNITY): Payer: Self-pay | Admitting: Psychiatry

## 2016-09-30 MED ORDER — LAMOTRIGINE 25 MG PO TABS
25.0000 mg | ORAL_TABLET | Freq: Once | ORAL | Status: AC
  Administered 2016-09-30: 25 mg via ORAL
  Filled 2016-09-30: qty 1

## 2016-09-30 MED ORDER — LAMOTRIGINE 100 MG PO TABS
50.0000 mg | ORAL_TABLET | Freq: Every day | ORAL | Status: DC
  Administered 2016-10-01: 50 mg via ORAL
  Filled 2016-09-30: qty 7
  Filled 2016-09-30 (×3): qty 2

## 2016-09-30 NOTE — [facility] Group Notes (Signed)
[facility] Group Notes:  (Counselor/Nursing/MHT/Case Management/Adjunct)  09/30/2016 1:15PM  Type of Therapy:  Group Therapy  Participation Level:  Active  Participation Quality:  Appropriate  Affect:  Flat  Cognitive:  Oriented  Insight:  Improving  Engagement in Group:  Limited  Engagement in Therapy:  Limited  Modes of Intervention:  Discussion, Exploration and Socialization  Summary of Progress/Problems: The topic for group was balance in life.  Pt participated in the discussion about when their life was in balance and out of balance and how this feels.  Pt discussed ways to get back in balance and short term goals they can work on to get where they want to be. Stayed for about half of group.  Engaged while there.  Stated she is unbalanced, but clarified it is because she is bored here and needs to be at home with her husband and child "where I stay busy with helping with homework and reading to him, and doing chores.  Otherwise I get bored, and I have racing thoughts."  Looking forward to d/c tomorrow.     [name], Jessica Steele 09/30/2016 3:28 PM

## 2016-09-30 NOTE — Progress Notes (Signed)
Nursing Note 09/30/2016 [id number]  Data Reports sleeping well.  Did not complete self-inventory.   Affect wide-ranged and appropriate.  Attending groups, spends free time in room.  Denies HI, SI, AVH.  Today complains of some shoulder stiffness which she said exercises helped.  C/O still feeling like she is part of the TV programs she is watching.  Reports feeling like people are talking about her "The other patient's don't smile at me, I don't think they like me."   Action Spoke with patient 1:1, nurse offered support to patient throughout shift.  Support given to patient, affirmed that she is a great person to know and that this nurse can't see any reason why anyone wouldn't like her.    Continues to be monitored on 15 minute checks for safety.  Response Remains safe and appropriate though a little insecure about self.

## 2016-09-30 NOTE — Progress Notes (Signed)
Pt. Stated "nerves about going home". Pt. Stated she does know how life will be like when she leaves the hospital with her husband and family. She smiled when discussing her son and her life when she was working before having her son.

## 2016-09-30 NOTE — Progress Notes (Signed)
D: Pt denies SI/HI/AVH. Pt is pleasant and cooperative. Pt continues to present as paranoid, but pt will talk with writer if prompted, but pt still will forward little information, but pt did interact more with pt than previous meetings.   A: Pt was offered support and encouragement. Pt was given scheduled medications. Pt was encourage to attend groups. Q 15 minute checks were done for safety.   R: Pt is taking medication. Pt has no complaints.Pt receptive to treatment and safety maintained on unit.

## 2016-09-30 NOTE — Progress Notes (Signed)
  [facility] :  Will you be returning to the same living situation after discharge:  Yes,  home At discharge, do you have transportation home?: Yes,  husband Do you have the ability to pay for your medications: Yes,  insurance  Release of information consent forms completed and in the chart;  Patient's signature needed at discharge.  Patient to Follow up at: Follow-up Information    [facility] Follow up on 10/07/2016.   Specialty:  Behavioral Health Why:  Thursday at 8:15 with Dr Darolyn RuaAgerwal      Contact information: [address] [place] Steele [place] [id number] [phone]          Next level of care provider has access to [facility] Link:yes  Safety Planning and Suicide Prevention discussed: Yes,  yes  Have you used any form of tobacco in the last 30 days? (Cigarettes, Smokeless Tobacco, Cigars, and/or Pipes): No  Has patient been referred to the Quitline?: N/A patient is not a smoker  Patient has been referred for addiction treatment: N/A  Jessica RogueRodney B Arlind Steele 09/30/2016, 3:52 PM

## 2016-09-30 NOTE — Progress Notes (Signed)
[facility] MD Progress Note  09/30/2016 10:15 AM Jessica Steele  MRN:  [id number] Subjective:  Patient states "I feel better but for some reason when I eat, I feel like I'm eating people."   Objective:Jessica Steele is a 35 y old [place] female , married , unemployed , lives with husband and 33 year old son in [place] , who has a hx of Bipolar disorder , presented to [facility] as a walk in for worsening mood sx, sleep issues and psychosis.  Pt seen and chart reviewed. Pt is alert/oriented x4, calm, cooperative, and appropriate to situation. Pt denies suicidal/homicidal ideation. However, pt reports that she feels as though she is eating people when she eats her meals in the cafeteria. For this reason, pt would like to eat in her room. Pt has improved greatly during her stay here and does not present as psychotic. Pt continues to report severe social anxiety imagining that others are talking about her or that the television is also talking about her. Nursing reports that she has been much more appropriate today. Will go up slightly on her Lamictal and will leave Geodon the same as it is still reaching therapeutic levels.   Principal Problem: Bipolar disorder, curr episode mixed, severe, with psychotic features (HCC) Diagnosis:   Patient Active Problem List   Diagnosis Date Noted  . Bipolar disorder, curr episode mixed, severe, with psychotic features (HCC) [F31.64] 09/20/2016   Total Time spent with patient: 25 minutes   Past Psychiatric History: Please see H&P.   Past Medical History:  Past Medical History:  Diagnosis Date  . Anxiety   . Bipolar disorder (HCC)   . Depression   . Fast heart beat    associated with anxiety  . PCOS (polycystic ovarian syndrome)   . Schizophrenia [facility])     Past Surgical History:  Procedure Laterality Date  . CESAREAN SECTION     Family History:  Family History  Problem Relation Age of Onset  . Alcohol abuse Neg Hx   . Anxiety disorder Neg Hx   . Bipolar  disorder Neg Hx   . Drug abuse Neg Hx   . Depression Neg Hx   . Schizophrenia Neg Hx   . Suicidality Neg Hx    Family Psychiatric  History: Please see H&P.  Social History: Please see H&P.  History  Alcohol Use No     History  Drug Use No    Social History   Social History  . Marital status: Married    Spouse name: N/A  . Number of children: 1  . Years of education: 66   Social History Main Topics  . Smoking status: Never Smoker  . Smokeless tobacco: Never Used     Comment: non smoker  . Alcohol use No  . Drug use: No  . Sexual activity: Yes    Birth control/ protection: None   Other Topics Concern  . None   Social History Narrative   Married and living with husband and 5yo son. 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 Grew up in [place]. Pt has a BS in engineering and denies problems in school.    Additional Social History:    Pain Medications: denied Prescriptions: risperdal   klonipin Over the Counter: tylenol History of alcohol / drug use?: No history of alcohol / drug abuse Longest period of sobriety (when/how long): never used alcohol, drugs, tobacco Negative Consequences of Use: Financial Withdrawal Symptoms: Other (Comment) (denied  withdrawals)                    Sleep: Fair  Appetite:  Fair  Current Medications: Current Facility-Administered Medications  Medication Dose Route Frequency Provider Last Rate Last Dose  . acetaminophen (TYLENOL) tablet 650 mg  650 mg Oral Q6H PRN Laveda Abbe, NP      . alum & mag hydroxide-simeth (MAALOX/MYLANTA) 200-200-20 MG/5ML suspension 30 mL  30 mL Oral Q4H PRN Laveda Abbe, NP      . benztropine (COGENTIN) tablet 1 mg  1 mg Oral QHS Beau Fanny, FNP   1 mg at 09/29/16 2128  . lamoTRIgine (LAMICTAL) tablet 25 mg  25 mg Oral Daily Jomarie Longs, MD   25 mg at 09/30/16 1610  . lamoTRIgine (LAMICTAL) tablet 50 mg  50 mg Oral QPM Saramma Eappen, MD   50 mg at 09/29/16  1710  . lidocaine (LIDODERM) 5 % 1 patch  1 patch Transdermal Q24H Jomarie Longs, MD   1 patch at 09/29/16 1159  . LORazepam (ATIVAN) tablet 1 mg  1 mg Oral Q6H PRN Jomarie Longs, MD   1 mg at 09/27/16 1811   Or  . LORazepam (ATIVAN) injection 1 mg  1 mg Intramuscular Q6H PRN Saramma Eappen, MD      . magnesium hydroxide (MILK OF MAGNESIA) suspension 30 mL  30 mL Oral Daily PRN Laveda Abbe, NP   30 mL at 09/24/16 2020  . mirtazapine (REMERON) tablet 7.5 mg  7.5 mg Oral QHS Jomarie Longs, MD   7.5 mg at 09/29/16 2128  . OLANZapine (ZYPREXA) tablet 5 mg  5 mg Oral TID PRN Jomarie Longs, MD   5 mg at 09/24/16 0509   Or  . OLANZapine (ZYPREXA) injection 5 mg  5 mg Intramuscular TID PRN Jomarie Longs, MD      . ziprasidone (GEODON) capsule 60 mg  60 mg Oral BID WC Saramma Eappen, MD   60 mg at 09/30/16 9604    Lab Results:  No results found for this or any previous visit (from the past 48 hour(s)).  Blood Alcohol level:  Lab Results  Component Value Date   [facility] <11 11/10/2011    Metabolic Disorder Labs: Lab Results  Component Value Date   HGBA1C 5.6 09/21/2016   MPG 114 09/21/2016   Lab Results  Component Value Date   PROLACTIN 116.6 (H) 09/21/2016   Lab Results  Component Value Date   CHOL 194 09/21/2016   TRIG 156 (H) 09/21/2016   HDL 37 (L) 09/21/2016   CHOLHDL 5.2 09/21/2016   VLDL 31 09/21/2016   LDLCALC 126 (H) 09/21/2016    Physical Findings: AIMS: Facial and Oral Movements Muscles of Facial Expression: None, normal Lips and Perioral Area: None, normal Jaw: None, normal Tongue: None, normal,Extremity Movements Upper (arms, wrists, hands, fingers): None, normal Lower (legs, knees, ankles, toes): None, normal, Trunk Movements Neck, shoulders, hips: None, normal, Overall Severity Severity of abnormal movements (highest score from questions above): None, normal Incapacitation due to abnormal movements: None, normal Patient's awareness of abnormal  movements (rate only patient's report): No Awareness, Dental Status Current problems with teeth and/or dentures?: No Does patient usually wear dentures?: No  CIWA:  CIWA-Ar Total: 1 COWS:  COWS Total Score: 2  Musculoskeletal: Strength & Muscle Tone: within normal limits Gait & Station: normal Patient leans: N/A  Psychiatric Specialty Exam: Physical Exam  Nursing note and vitals reviewed.   Review of Systems  Psychiatric/Behavioral: Positive for depression. Negative for hallucinations and suicidal ideas. The patient is nervous/anxious. The patient does not have insomnia.   All other systems reviewed and are negative.   Blood pressure 117/69, pulse 91, temperature 98.5 F (36.9 C), temperature source Oral, resp. rate 20, height 5\' 2"  (1.575 m), weight 75.8 kg (167 lb), last menstrual period 11/02/2011, SpO2 100 %.Body mass index is 30.54 kg/m.  General Appearance: Casual and Fairly Groomed  Eye Contact:  Good  Speech:  Normal rate, clear and coherent  Volume:  Normal  Mood:  Anxious improving but still somewhat paranoid  Affect: congruent   Thought Process:  Goal Directed and Descriptions of Associations: Circumstantial improving but focused on what others think of her  Orientation:  Full (Time, Place, and Person)  Thought Content:  Logical improving (focused on fears of discharge)  Suicidal Thoughts:  No   Homicidal Thoughts:  No  Memory:  Immediate;   Fair Recent;   Fair Remote;   Poor  Judgement:  Impaired  Insight:  Shallow  Psychomotor Activity:  Normal  Concentration:  Concentration: Fair and Attention Span: Fair  Recall:  [employer]  Fund of Knowledge:  Fair  Language:  Fair  Akathisia:  No  Handed:  Right  AIMS (if indicated):     Assets:  Desire for Improvement  ADL's:  Intact  Cognition:  WNL  Sleep:  Number of Hours: 6    Treatment Plan Summary:Shatara Candace Cruisenandan is a [age] y old [place] female , married , unemployed , lives [place] 33 year [place]  , who has a hx of Bipolar disorder , [facility] for worsening mood sx, sleep issues. Patient continues to improve , although has periodic anxiety, and tearfulness . Will continue treatment.  Bipolar disorder, curr episode mixed, severe, with psychotic features (HCC) improving, managed as below with the following changes on 09/30/16  Daily contact with patient to assess and evaluate symptoms and progress in treatment and Medication management  Will continue Geodon 60 mg PO bid with meals for mood sx.  Will continue Cogentin 0.5 mg po qhs  for EPS.  Will continue Ativan 1 mg po q6h prn for severe anxiety/agitation. Increased Lamictal to 50 mg po daily and 50 mg po qpm  for mood lability.  Will continue Remeron 7.5 mg po qhs for sleep tonight. Will make available PRN medications as per agitation protocol. Will continue to monitor vitals ,medication compliance and treatment side effects while patient is here.  Will monitor for medical issues as well as call consult as needed.  Ordered labs as above- cbc - wnl except for wblc slightly elevated at 13, cmp - wnl, lipid panel, wnl, tsh - wnl, EKG for qtc wnl , pregnancy test - negative, PL - elevated at 116 - will need to be monitored - likely due to risperidone since she has a hx of the same. Was able to obtain collateral information from Dr.Agarwal as well as husband - pls see H&P. CSW will continue working on disposition. Collateral information was obtained from husband today per CSW - please see above. Patient to participate in therapeutic milieu.   Beau FannyWithrow, Akshita Italiano C, FNP 09/30/2016, 10:15 AM

## 2016-09-30 NOTE — Progress Notes (Signed)
Adult Psychoeducational Group Note  Date:  09/30/2016 Time:  [id number]  Group Topic/Focus:  Goals Group:   The focus of this group is to help patients establish daily goals to achieve during treatment and discuss how the patient can incorporate goal setting into their daily lives to aide in recovery.  Participation Level:  Active  Participation Quality:  Attentive  Insight: improving  Engagement in Group:  improving  Modes of Intervention:  Discussion, Education, Rapport Building and Support  Lindajo RoyalDaniel P Tylisha Danis 09/30/2016, 7:37 PM

## 2016-09-30 NOTE — Tx Team (Signed)
Interdisciplinary Treatment and Diagnostic Plan Update  09/30/2016 Time of Session: 3:53 PM  Jessica Steele MRN: [id number]  Principal Diagnosis: Bipolar disorder, curr episode mixed, severe, with psychotic features ([place])  Secondary Diagnoses: Principal Problem:   Bipolar disorder, curr episode mixed, severe, with psychotic features ([place])   Current Medications:  Current Facility-Administered Medications  Medication Dose Route Frequency Provider Last Rate Last Dose  . acetaminophen (TYLENOL) tablet 650 mg  650 mg Oral Q6H PRN Ethelene Hal, NP      . alum & mag hydroxide-simeth (MAALOX/MYLANTA) 200-200-20 MG/5ML suspension 30 mL  30 mL Oral Q4H PRN Ethelene Hal, NP      . benztropine (COGENTIN) tablet 1 mg  1 mg Oral QHS Benjamine Mola, FNP   1 mg at 09/29/16 2128  . lamoTRIgine (LAMICTAL) tablet 25 mg  25 mg Oral Daily Ursula Alert, MD   25 mg at 09/30/16 6553  . lamoTRIgine (LAMICTAL) tablet 50 mg  50 mg Oral QPM Saramma Eappen, MD   50 mg at 09/29/16 1710  . lidocaine (LIDODERM) 5 % 1 patch  1 patch Transdermal Q24H Ursula Alert, MD   1 patch at 09/29/16 1159  . LORazepam (ATIVAN) tablet 1 mg  1 mg Oral Q6H PRN Ursula Alert, MD   1 mg at 09/27/16 1811   Or  . LORazepam (ATIVAN) injection 1 mg  1 mg Intramuscular Q6H PRN Saramma Eappen, MD      . magnesium hydroxide (MILK OF MAGNESIA) suspension 30 mL  30 mL Oral Daily PRN Ethelene Hal, NP   30 mL at 09/24/16 2020  . mirtazapine (REMERON) tablet 7.5 mg  7.5 mg Oral QHS Ursula Alert, MD   7.5 mg at 09/29/16 2128  . OLANZapine (ZYPREXA) tablet 5 mg  5 mg Oral TID PRN Ursula Alert, MD   5 mg at 09/24/16 0509   Or  . OLANZapine (ZYPREXA) injection 5 mg  5 mg Intramuscular TID PRN Ursula Alert, MD      . ziprasidone (GEODON) capsule 60 mg  60 mg Oral BID WC Ursula Alert, MD   60 mg at 09/30/16 7482    PTA Medications: Prescriptions Prior to Admission  Medication Sig Dispense Refill Last Dose  .  clonazePAM (KLONOPIN) 1 MG tablet Take 1 mg by mouth 2 (two) times daily as needed for anxiety.   09/18/2016 at Unknown time  . diphenhydrAMINE (BENADRYL) 25 MG tablet Take 1 tablet (25 mg total) by mouth at bedtime as needed for sleep. 30 tablet 0 Past Week at Unknown time  . risperiDONE (RISPERDAL) 1 MG tablet Take 1 tablet (1 mg total) by mouth at bedtime. 30 tablet 2 09/18/2016 at Unknown time    Treatment Modalities: Medication Management, Group therapy, Case management,  1 to 1 session with clinician, Psychoeducation, Recreational therapy.   Physician Treatment Plan for Primary Diagnosis: Bipolar disorder, curr episode mixed, severe, with psychotic features ([place]) Long Term Goal(s): Improvement in symptoms so as ready for discharge  Short Term Goals: Ability to identify changes in lifestyle to reduce recurrence of condition will improve  Medication Management: Evaluate patient's response, side effects, and tolerance of medication regimen.  Therapeutic Interventions: 1 to 1 sessions, Unit Group sessions and Medication administration.  Evaluation of Outcomes: Adequate for Discharge  Physician Treatment Plan for Secondary Diagnosis: Principal Problem:   Bipolar disorder, curr episode mixed, severe, with psychotic features ([place])   Long Term Goal(s): Improvement in symptoms so as ready for discharge  Short Term  Goals: Ability to verbalize feelings will improve  Medication Management: Evaluate patient's response, side effects, and tolerance of medication regimen.  Therapeutic Interventions: 1 to 1 sessions, Unit Group sessions and Medication administration.  Evaluation of Outcomes: Adequate for Discharge  09/23/16:Will cross titrate  Risperidone with Geodon. Will start Geodon at 40 mg po bid with meals today for mood lability/psychosis . Will increase to Geodon 60 mg PO bid with meals tomorrow . Discussed with Pharmacist Jiles Garter - higher dose will be less stimulating for her . Will  continue Cogentin 0.5 mg po qhs for EPS. Will continue Ativan 1 mg po q6h prn for severe anxiety/agitation. Will continue Lamictal 25 mg po bid for mood lability. Will discontinue Trazodone for lack of efficacy. Start Remeron 7.5 mg po qhs for sleep tonight. Reassess tomorrow.   RN Treatment Plan for Primary Diagnosis: Bipolar disorder, curr episode mixed, severe, with psychotic features ([place]) Long Term Goal(s): Knowledge of disease and therapeutic regimen to maintain health will improve  Short Term Goals: Ability to verbalize frustration and anger appropriately will improve and Ability to demonstrate self-control  Medication Management: RN will administer medications as ordered by provider, will assess and evaluate patient's response and provide education to patient for prescribed medication. RN will report any adverse and/or side effects to prescribing provider.  Therapeutic Interventions: 1 on 1 counseling sessions, Psychoeducation, Medication administration, Evaluate responses to treatment, Monitor vital signs and CBGs as ordered, Perform/monitor CIWA, COWS, AIMS and Fall Risk screenings as ordered, Perform wound care treatments as ordered.  Evaluation of Outcomes: Adequate for Discharge   LCSW Treatment Plan for Primary Diagnosis: Bipolar disorder, curr episode mixed, severe, with psychotic features ([place]) Long Term Goal(s): Safe transition to appropriate next level of care at discharge, Engage patient in therapeutic group addressing interpersonal concerns.  Short Term Goals: Engage patient in aftercare planning with referrals and resources  Therapeutic Interventions: Assess for all discharge needs, 1 to 1 time with Social worker, Explore available resources and support systems, Assess for adequacy in community support network, Educate family and significant other(s) on suicide prevention, Complete Psychosocial Assessment, Interpersonal group therapy.  Evaluation of Outcomes: Met   Return home, follow up outpt     Progress in Treatment: Attending groups: Occasionally Participating in groups: Miminal Taking medication as prescribed: Yes Toleration medication: Yes, no side effects reported at this time Family/Significant other contact made: Yes, with husband Patient understands diagnosis: No  Limited insight Discussing patient identified problems/goals with staff: Yes Medical problems stabilized or resolved: Yes Denies suicidal/homicidal ideation: Yes Issues/concerns per patient self-inventory: None Other: N/A  New problem(s) identified: None identified at this time.   New Short Term/Long Term Goal(s): None identified at this time.   Discharge Plan or Barriers: return home, follow up outpt  Reason for Continuation of Hospitalization:     Estimated Length of Stay:Likely d/c tomorrow  Attendees: Patient: 09/30/2016  3:53 PM  Physician: Ursula Alert, MD 09/30/2016  3:53 PM  Nursing: Jan Fireman RN 09/30/2016  3:53 PM  RN Care Manager: 09/30/2016  3:53 PM  Social Worker: Eusebio Me LCSW 09/30/2016  3:53 PM  Recreational Therapist: Winfield Cunas LRT 09/30/2016  3:53 PM  Other: Norberto Sorenson P4CC 09/30/2016  3:53 PM  Other:  09/30/2016  3:53 PM    Scribe for Treatment Team:  Edwyna Shell LCSW 09/30/2016 3:53 PM

## 2016-09-30 NOTE — Progress Notes (Signed)
Recreation Therapy Notes  Date: 09/30/16 Time: 1000 Location: 500 Hall Dayroom  Group Topic: Leisure Education  Goal Area(s) Addresses:  Patient will identify positive leisure activities.  Patient will identify one positive benefit of participation in leisure activities.   Behavioral Response: Engaged  Intervention: UnitedHealthBeach ball, chairs  Activity: Insurance claims handlereated Soccer.  LRT arranged chairs in a semi circle for patients.  Patients were to sit in the semi circle and try to get the ball past the LRT.  Patients were to remain seated at all times.  Education:  Leisure Education, Building control surveyorDischarge Planning  Education Outcome: Acknowledges education/In group clarification offered/Needs additional education  Clinical Observations/Feedback: Pt was engaged and smiling for the most part.  At other times, pt seemed to be thinking about other things.  Pt worked well with peers.      Caroll RancherMarjette Zenab Gronewold, LRT/CTRS     Caroll RancherLindsay, Diya Gervasi A 09/30/2016 11:39 AM

## 2016-10-01 MED ORDER — MIRTAZAPINE 7.5 MG PO TABS: 7.5000 mg | ORAL_TABLET | Freq: Every day | ORAL | 0 refills | Status: DC

## 2016-10-01 MED ORDER — LAMOTRIGINE 25 MG PO TABS: 50.0000 mg | ORAL_TABLET | Freq: Two times a day (BID) | ORAL | 0 refills | Status: AC

## 2016-10-01 MED ORDER — ZIPRASIDONE HCL 60 MG PO CAPS: 60.0000 mg | ORAL_CAPSULE | Freq: Two times a day (BID) | ORAL | 0 refills | Status: AC

## 2016-10-01 MED ORDER — BENZTROPINE MESYLATE 1 MG PO TABS: 1.0000 mg | ORAL_TABLET | Freq: Every day | ORAL | 0 refills | Status: AC

## 2016-10-01 NOTE — Progress Notes (Signed)
Recreation Therapy Notes  Date: 10/01/16 Time: 1000 Location: 500 Hall Dayroom  Group Topic: Stress Management  Goal Area(s) Addresses:  Patient will verbalize importance of using healthy stress management.  Patient will identify positive emotions associated with healthy stress management.   Behavioral Response:  Engaged  Intervention: Stress Management  Activity :  Progressive Muscle Relaxation, [facility].  LRT introduced the stress management techniques of progressive muscle relaxation and guided imagery.  LRT read scripts to engage patients in the techniques.  Patients were to follow along as LRT read the scripts.  Education:  Stress Management, Discharge Planning.   Education Outcome: Acknowledges edcuation/In group clarification offered/Needs additional education  Clinical Observations/Feedback: Pt stated her muscles stiffen up when she is stressed.  Pt also stated she goes to sleep when stressed.  Pt expressed she liked the progressive muscle relaxation.  Pt also stated the liked the guided imagery because of the "relaxing background sounds".     Caroll RancherMarjette Hatim Homann, LRT/CTRS     Caroll RancherLindsay, Lucia Mccreadie A 10/01/2016 11:55 AM

## 2016-10-01 NOTE — Discharge Summary (Signed)
Physician Discharge Summary Note  Patient:  Jessica Steele is an 35 y.o., female MRN:  [id number] DOB:  16-Oct-1981 Patient phone:  [phone] (home)  Patient address:   [address] [place] [address],  Total Time spent with patient: 45 minutes  Date of Admission:  09/19/2016 Date of Discharge: 10/01/2016  Reason for Admission:   Jessica Steele is a 24 y old [place] female , married , unemployed , lives with husband and 71 year old son in [place] , who has a hx of Bipolar disorder , presented to [facility] as a walk in for worsening mood sx, sleep issues and psychosis.   Per initial notes in EHR: Pt presents voluntarily to [facility] for assessment accompanied by her husband, Bhanu. Pt's affect is labile and restless. She is dressed appropriately for the weather. She cries at some points, and at other times she smiles and giggles. Sometimes pt closes eyes and rests head on husband's shoulder. Pt's speech is circumstantialand tangential.More than once, pt says, "Be true to yourself. Listen to your conscience."Per chart review, Pt sees Dr Landis Gandy at [facility] outpatient clinic. Pt came in to [facility] assessment clinic yesterday. On 09/16/16 Dr Ladona Ridgel called in risperidone 1 mg for pt. Apparently, pt also presented to [facility] last night for insomnia.  Collateral info provided by husband, Bhanu. He reports pt has barely slept in approx. 4 days. Per chart review, pt was admitted inpatient to [facility] for 10 days in 2012. He sts pt hasn't been on any psych meds since April 2017 when she stopped taking meds b/c of side effects. He reports pt visited [place] for 40 days and returned this july. Husband reports lately pt has been screaming at their 68 yo son and at him. He says she is slamming doors and walking in and outside several times an hour. Husband reports pt put cotton in her ears last night b/c she was "hearing voices". He says that pt thought characters on a tv show last night were talking about  her."   Patient seen and chart reviewed today .Discussed patient with treatment team. Patient today is seen as intrusive , restless often , labile , inappropriate , she sat through the entire evaluation with her eyes closed. Patient is disorganized and tangential , making irrelevant statements on and off going off topic often, has loose thought process. Pt reports she has been having some increased stress recently due to her staying at home , them buying a house and from her mother in law with whom she has conflicts.Patient being disorganized is a poor historian at this time and hence contacted husband to obtain collateral information with patient's consent.  Collateral information was obtained from husband Mr.Jessica Steele - patient sat with her eyes closed during the phone call and later on was seen as being intrusive , unable to sit still , Primary school teacher for water multiple times when Clinical research associate was speaking to husband and needed redirection several times. Per husband - patient was kind of stressed out and anxious about them buying a house - husband gave the sellers a check on Wednesday - patient became more and more argumentative that day , and stopped sleeping , was noted as making irrelevant statements . Hence patient was taken to the out patient clinic where Dr.Taylor saw her and started her back on risperidone . But inspite of that she did not seems to get better . Per him patient was talking about shows on TV talking about her  and directed towards her and she appeared to be delusional . As per husband patient had her first admission in 2012 - when she started having some agitation , sleep issues- was admitted at [facility] for [facility] days or so .Patient was initially following up with Dr,Ahavulaia - was on depakote , effexor and klonopin , but depakote was stopped sometime in 2014. Klonopin was stopped sometime in 2015. Per husband klonopin is an addcitiev medication and that is another reason for  stopping it and he wonders whether she really needs it.She was in [place] recently in June for 40 days and at that time was not compliant with any of her medications - per husband all medications were stopped.Patient usually presents as very energetic , doing a lot of things at home , wants everything to be perfect and that according to him is her drawback. Per husband , she was an angry person to begin with , and continues to have anger issues often . 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 When patient got back she had a lot of pressure from work and felt like she could not manage it and eventually lost her job.  Principal Problem: Bipolar disorder, curr episode mixed, severe, with psychotic features [facility]) Discharge Diagnoses: Patient Active Problem List   Diagnosis Date Noted  . Bipolar disorder, curr episode mixed, severe, with psychotic features [facility]) [F31.64] 09/20/2016    Past Psychiatric History: See H&P  Past Medical History:  Past Medical History:  Diagnosis Date  . Anxiety   . Bipolar disorder (HCC)   . Depression   . Fast heart beat    associated with anxiety  . PCOS (polycystic ovarian syndrome)   . Schizophrenia [facility])     Past Surgical History:  Procedure Laterality Date  . CESAREAN SECTION     Family History:  Family History  Problem Relation Age of Onset  . Alcohol abuse Neg Hx   . Anxiety disorder Neg Hx   . Bipolar disorder Neg Hx   . Drug abuse Neg Hx   . Depression Neg Hx   . Schizophrenia Neg Hx   . Suicidality Neg Hx    Family Psychiatric  History: See H&P Social History:  History  Alcohol Use No     History  Drug Use No    Social History   Social History  . Marital status: Married    Spouse name: N/A  . Number of children: 1  . Years of education: 27   Social History Main Topics  .  Smoking status: Never Smoker  . Smokeless tobacco: Never Used     Comment: non smoker  . Alcohol use No  . Drug use: No  . Sexual activity: Yes    Birth control/ protection: None   Other Topics Concern  . None   Social History Narrative   Married and living with husband and 5yo son. 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 Grew up in [place]. Pt has a BS in engineering and denies problems in school.     Hospital Course:   Gwenda Heiner was admitted for Bipolar disorder, curr episode mixed, severe, with psychotic features (HCC) , with psychosis and crisis management.  Pt was treated discharged with the medications listed below under Medication List.  Medical  problems were identified and treated as needed.  Home medications were restarted as appropriate.  Improvement was monitored by observation and Lauro Franklineepa Tiemann 's daily report of symptom reduction.  Emotional and mental status was monitored by daily self-inventory reports completed by Lauro Franklineepa Salih and clinical staff.         Lauro FranklinDeepa Vasudevan was evaluated by the treatment team for stability and plans for continued recovery upon discharge. Lauro FranklinDeepa Enis 's motivation was an integral factor for scheduling further treatment. Employment, transportation, bed availability, health status, family support, and any pending legal issues were also considered during hospital stay. Pt was offered further treatment options upon discharge including but not limited to Residential, Intensive Outpatient, and Outpatient treatment.  Lauro FranklinDeepa Gubbels will follow up with the services as listed below under Follow Up Information.     Upon completion of this admission the patient was both mentally and medically stable for discharge denying suicidal/homicidal ideation, auditory/visual/tactile hallucinations, delusional thoughts and paranoia.    Kealohilani Rochon responded well to treatment with cogentin, lamictal, Remeron, Geodon without adverse effects. Pt  demonstrated improvement without reported or observed adverse effects to the point of stability appropriate for outpatient management. Pertinent labs include: hyperlipidemia, Prolactin 116.6H which requires followup. Reviewed CBC, CMP, BAL, and UDS; all unremarkable aside from noted exceptions.   Physical Findings: AIMS: Facial and Oral Movements Muscles of Facial Expression: None, normal Lips and Perioral Area: None, normal Jaw: None, normal Tongue: None, normal,Extremity Movements Upper (arms, wrists, hands, fingers): None, normal Lower (legs, knees, ankles, toes): None, normal, Trunk Movements Neck, shoulders, hips: None, normal, Overall Severity Severity of abnormal movements (highest score from questions above): None, normal Incapacitation due to abnormal movements: None, normal Patient's awareness of abnormal movements (rate only patient's report): No Awareness, Dental Status Current problems with teeth and/or dentures?: No Does patient usually wear dentures?: No  CIWA:  CIWA-Ar Total: 1 COWS:  COWS Total Score: 2  Musculoskeletal: Strength & Muscle Tone: within normal limits Gait & Station: normal Patient leans: N/A  Psychiatric Specialty Exam: Physical Exam  Review of Systems  Psychiatric/Behavioral: Positive for depression. Negative for hallucinations, substance abuse and suicidal ideas. The patient is nervous/anxious and has insomnia.   All other systems reviewed and are negative.   Blood pressure 118/73, pulse 95, temperature 97.6 F (36.4 C), temperature source Oral, resp. rate 16, height 5\' 2"  (1.575 m), weight 75.8 kg (167 lb), last menstrual period 11/02/2011, SpO2 100 %.Body mass index is 30.54 kg/m.  SEE MD PSE WITHIN SRA    Have you used any form of tobacco in the last 30 days? (Cigarettes, Smokeless Tobacco, Cigars, and/or Pipes): No  Has this patient used any form of tobacco in the last 30 days? (Cigarettes, Smokeless Tobacco, Cigars, and/or Pipes) No  Blood  Alcohol level:  Lab Results  Component Value Date   [facility] <11 11/10/2011    Metabolic Disorder Labs:  Lab Results  Component Value Date   HGBA1C 5.6 09/21/2016   MPG 114 09/21/2016   Lab Results  Component Value Date   PROLACTIN 116.6 (H) 09/21/2016   Lab Results  Component Value Date   CHOL 194 09/21/2016   TRIG 156 (H) 09/21/2016   HDL 37 (L) 09/21/2016   CHOLHDL 5.2 09/21/2016   VLDL 31 09/21/2016   LDLCALC 126 (H) 09/21/2016    See Psychiatric Specialty Exam and Suicide Risk Assessment completed by Attending Physician prior to discharge.  Discharge destination:  Home  Is patient on multiple antipsychotic therapies at  discharge:  No   Has Patient had three or more failed trials of antipsychotic monotherapy by history:  No  Recommended Plan for Multiple Antipsychotic Therapies: NA     Medication List    STOP taking these medications   clonazePAM 1 MG tablet Commonly known as:  KLONOPIN   diphenhydrAMINE 25 MG tablet Commonly known as:  BENADRYL   risperiDONE 1 MG tablet Commonly known as:  RISPERDAL     TAKE these medications     Indication  benztropine 1 MG tablet Commonly known as:  COGENTIN Take 1 tablet (1 mg total) by mouth at bedtime.  Indication:  Extrapyramidal Reaction caused by Medications   lamoTRIgine 25 MG tablet Commonly known as:  LAMICTAL Take 2 tablets (50 mg total) by mouth 2 (two) times daily at 8 am and 10 pm.  Indication:  mood stabilization   mirtazapine 7.5 MG tablet Commonly known as:  REMERON Take 1 tablet (7.5 mg total) by mouth at bedtime.  Indication:  Trouble Sleeping, Major Depressive Disorder   ziprasidone 60 MG capsule Commonly known as:  GEODON Take 1 capsule (60 mg total) by mouth 2 (two) times daily with a meal.  Indication:  Manic-Depression      Follow-up Information    [facility] Follow up on 10/07/2016.   Specialty:  Behavioral Health Why:  Thursday at 8:15 with  Dr Darolyn Rua information: [address] [place] [address] [phone]          Follow-up recommendations:  Activity:  As tolerated Diet:  Heart healthy with low sodium.  Comments:   Take all medications as prescribed. Keep all follow-up appointments as scheduled.  Do not consume alcohol or use illegal drugs while on prescription medications. Report any adverse effects from your medications to your primary care provider promptly.  In the event of recurrent symptoms or worsening symptoms, call 911, a crisis hotline, or go to the nearest emergency department for evaluation.   Signed: Beau Fanny, FNP 10/01/2016, 10:24 AM

## 2016-10-01 NOTE — Plan of Care (Signed)
Problem: [facility] Participation in Recreation Therapeutic Interventions Goal: STG-Patient will attend/participate in Rec Therapy Group Ses STG-The Patient will attend and participate in Recreation Therapy Group Sessions   Outcome: Completed/Met Date Met: 10/01/16 Pt was able to complete goal by attending and participating in stress management, leisure education and stress management recreation therapy sessions.  Victorino Sparrow, LRT/CTRS

## 2016-10-01 NOTE — [facility] Suicide Risk Assessment (Signed)
[facility]   Principal Problem: Bipolar disorder, curr episode mixed, severe, with psychotic features [facility](HCC) Discharge Diagnoses:  Patient Active Problem List   Diagnosis Date Noted  . Bipolar disorder, curr episode mixed, severe, with psychotic features (HCC) [F31.64] 09/20/2016    Total Time spent with patient: 20 minutes  Musculoskeletal: Strength & Muscle Tone: within normal limits Gait & Station: normal Patient leans: N/A  Psychiatric Specialty Exam: ROS  Blood pressure 118/73, pulse 95, temperature 97.6 F (36.4 C), temperature source Oral, resp. rate 16, height 5\' 2"  (1.575 m), weight 167 lb (75.8 kg), last menstrual period 11/02/2011, SpO2 100 %.Body mass index is 30.54 kg/m.  General Appearance: Casual  Eye Contact::  Fair  Speech:  Clear and Coherent409  Volume:  Decreased  Mood:  Anxious  Affect:  Congruent  Thought Process:  Coherent  Orientation:  Full (Time, Place, and Person)  Thought Content:  Logical  Suicidal Thoughts:  No  Homicidal Thoughts:  No  Memory:  Immediate;   Fair Recent;   Fair Remote;   Fair  Judgement:  Fair  Insight:  Present  Psychomotor Activity:  Normal  Concentration:  Fair  Recall:  [employer]Fair  Fund of Knowledge:Fair  Language: Fair  Akathisia:  No  Handed:  Right  AIMS (if indicated):     Assets:  Communication Skills Desire for Improvement Housing Physical Health Social Support  Sleep:  Number of Hours: 6.25  Cognition: WNL  ADL's:  Intact   Mental Status Per Nursing Assessment::   On Admission:     Demographic Factors:  Patient is a married woman of [place] origin who has children and a supportive family.  Loss Factors: NA  Historical Factors: Impulsivity  Risk Reduction Factors:   Responsible for children under 33 years of age, Sense of responsibility to family, Positive social support and Positive coping skills or problem solving skills  Continued Clinical Symptoms:  Improved mood and  psychosis  Cognitive Features That Contribute To Risk:  None    Suicide Risk:  Minimal: No identifiable suicidal ideation.  Patients presenting with no risk factors but with morbid ruminations; may be classified as minimal risk based on the severity of the depressive symptoms  Follow-up Information    [facility] Follow up on 10/07/2016.   Specialty:  Behavioral Health Why:  Thursday at 8:15 with Dr Darolyn RuaAgerwal      Contact information: [address] [place] [id number] [phone]          Plan Of Care/Follow-up recommendations:  Activity:  Normal Diet:  Normal  Patient to follow up with her discharge appointments and medications. Patient and family aware of safety plan if she has self-harm thoughts.  Patrick NorthAVI, Luis Nickles, MD 10/01/2016, 11:03 AM

## 2016-10-01 NOTE — Progress Notes (Signed)
D:  Patient's self inventory sheet, patient sleeps good, sleep medication is helpful.  Good appetite, normal energy levell, good concentration.  Rated depression 1, hopeless 3, anxiety 6.  Denied withdrawals.  Denied SI.  Denied physical problems.  Denied pain.  Goal is to sleep, anger management and self control.  Plans to talk about problems.  "Thank you so much and sorry for the trouble."  Does have discharge plans. A:  Medications administered per MD orders.  Emotional support and encouragement given patient.   R:  Patient denied SI and HI, contracts for safety.  Denied A/V hallucinations.  Safety maintained with 15 minute checks.

## 2016-10-01 NOTE — Plan of Care (Signed)
Problem: Coping: Goal: Ability to verbalize feelings will improve Outcome: Progressing Nurse discussed depression/anxiety/coping skills with patient.    

## 2016-10-01 NOTE — Progress Notes (Signed)
Discharge Note:  Patient discharged home with husband.  Patient denied SI and HI.  Denied A/V hallucinations.  Denied pain.  Suicide prevention information given and discussed with patient who stated she understood and had no questions.  Patient stated she received all her belongings, clothing, toiletries, misc items, prescriptions, medications, etc.  All required discharge information given to patient at discharge.  Patient stated [facility].

## 2016-10-07 ENCOUNTER — Ambulatory Visit (HOSPITAL_COMMUNITY): Payer: Self-pay | Admitting: Psychiatry

## 2016-12-23 ENCOUNTER — Ambulatory Visit (HOSPITAL_COMMUNITY): Payer: Self-pay | Admitting: Psychiatry

## 2017-01-13 ENCOUNTER — Ambulatory Visit (HOSPITAL_COMMUNITY): Payer: Self-pay | Admitting: Psychiatry

## 2019-02-19 ENCOUNTER — Ambulatory Visit: Payer: 59 | Admitting: Family Medicine

## 2019-08-11 ENCOUNTER — Other Ambulatory Visit (HOSPITAL_COMMUNITY): Payer: Self-pay | Admitting: Psychiatry

## 2019-08-11 DIAGNOSIS — F319 Bipolar disorder, unspecified: Secondary | ICD-10-CM

## 2021-03-27 ENCOUNTER — Ambulatory Visit: Payer: 59 | Admitting: Physician Assistant

## 2021-04-03 ENCOUNTER — Ambulatory Visit: Payer: Self-pay

## 2021-04-03 ENCOUNTER — Encounter: Payer: Self-pay | Admitting: Physician Assistant

## 2021-04-03 ENCOUNTER — Ambulatory Visit: Payer: Self-pay | Admitting: Physician Assistant

## 2021-04-03 ENCOUNTER — Ambulatory Visit (INDEPENDENT_AMBULATORY_CARE_PROVIDER_SITE_OTHER): Payer: Managed Care, Other (non HMO) | Admitting: Physician Assistant

## 2021-04-03 DIAGNOSIS — S300XXA Contusion of lower back and pelvis, initial encounter: Secondary | ICD-10-CM

## 2021-04-03 MED ORDER — MELOXICAM 15 MG PO TABS: 15.0000 mg | ORAL_TABLET | Freq: Every day | ORAL | 2 refills | Status: DC

## 2021-04-03 NOTE — Progress Notes (Signed)
Office Visit Note   Patient: [place]           Date of Birth: [birth year]           MRN: [id number] Visit Date: 04/03/2021              Requested by: No referring provider defined for this encounter. PCP: No Pcp, Per Patient (Inactive)  No chief complaint on file.     HPI: Patient presents today 3 weeks status post falling down some stairs onto her tailbone.  She denies any bruising she denies any paresthesias or radicular symptoms or any weakness.  It has slowly gotten better over the last 3 weeks but she still has difficulty when she rolls a certain way when she is sleeping and for sitting for long periods of time.  She did has obtained a doughnut which seems to help she did have a Mobic at home which she used and it helped as well  Assessment & Plan: Visit Diagnoses:  1. Coccyx contusion, initial encounter     Plan: Findings consistent with a coccyx fracture/contusion.  I discussed with him the natural history of this I would recommend continue using the donut for symptomatic pain I will refill her Mobic.  I would expect over the course the next 3 weeks that she gets significantly better may follow-up in 3 weeks but can cancel this if she is doing well  Follow-Up Instructions: No follow-ups on file.   Ortho Exam  Patient is alert, oriented, no adenopathy, well-dressed, normal affect, normal respiratory effort. Examination of her coccyx she has minimal tenderness to deep palpation no bruising no clinical deformity.  She does have some reproducible pain only in that area with straight leg raising.  No radicular symptoms.  She has good strength bilaterally 5 out of 5 in her lower extremities  Imaging: XR Sacrum/Coccyx  Result Date: 04/03/2021 X-rays of her sacrum coccyx well-maintained alignment cannot exclude a fracture as there is some bony irregularity but this looks to be subacute  No images are attached to the encounter.  Labs: Lab Results  Component Value Date    HGBA1C 5.6 09/21/2016     No results found for: ALBUMIN, PREALBUMIN, CBC  No results found for: MG No results found for: VD25OH  No results found for: PREALBUMIN CBC EXTENDED Latest Ref Rng & Units 09/21/2016 11/10/2011  WBC 4.0 - 10.5 K/uL 13.0(H) 16.7(H)  RBC 3.87 - 5.11 MIL/uL 4.63 4.91  HGB 12.0 - 15.0 g/dL 16.5 79.0  HCT 38.3 - 33.8 % 39.0 42.5  PLT 150 - 400 K/uL 361 392  NEUTROABS 1.7 - 7.7 K/uL 7.7 11.4(H)  LYMPHSABS 0.7 - 4.0 K/uL 4.0 4.2(H)     There is no height or weight on file to calculate BMI.  Orders:  Orders Placed This Encounter  Procedures  . XR Sacrum/Coccyx   Meds ordered this encounter  Medications  . meloxicam (MOBIC) 15 MG tablet    Sig: Take 1 tablet (15 mg total) by mouth daily.    Dispense:  30 tablet    Refill:  2     Procedures: No procedures performed  Clinical Data: No additional findings.  ROS:  All other systems negative, except as noted in the HPI. Review of Systems  Objective: Vital Signs: LMP 11/02/2011   Specialty Comments:  No specialty comments available.  PMFS History: Patient Active Problem List   Diagnosis Date Noted  . Bipolar disorder, curr episode mixed, severe, with psychotic features (  HCC) 09/20/2016   Past Medical History:  Diagnosis Date  . Anxiety   . Bipolar disorder (HCC)   . Depression   . Fast heart beat    associated with anxiety  . PCOS (polycystic ovarian syndrome)   . Schizophrenia (HCC)     Family History  Problem Relation Age of Onset  . Alcohol abuse Neg Hx   . Anxiety disorder Neg Hx   . Bipolar disorder Neg Hx   . Drug abuse Neg Hx   . Depression Neg Hx   . Schizophrenia Neg Hx   . Suicidality Neg Hx     Past Surgical History:  Procedure Laterality Date  . CESAREAN SECTION     Social History   Occupational History  . Not on file  Tobacco Use  . Smoking status: Never Smoker  . Smokeless tobacco: Never Used  . Tobacco comment: non smoker  Substance and Sexual Activity   . Alcohol use: No  . Drug use: No  . Sexual activity: Yes    Birth control/protection: None

## 2021-04-24 ENCOUNTER — Encounter: Payer: Self-pay | Admitting: Physician Assistant

## 2021-04-24 ENCOUNTER — Ambulatory Visit (INDEPENDENT_AMBULATORY_CARE_PROVIDER_SITE_OTHER): Payer: Managed Care, Other (non HMO) | Admitting: Physician Assistant

## 2021-04-24 DIAGNOSIS — S300XXA Contusion of lower back and pelvis, initial encounter: Secondary | ICD-10-CM | POA: Diagnosis not present

## 2021-04-24 NOTE — Progress Notes (Signed)
Office Visit Note   Patient: [place]           Date of Birth: 11-06-1981           MRN: [id number] Visit Date: 04/24/2021              Requested by: No referring provider defined for this encounter. PCP: No Pcp, Per Patient (Inactive)  No chief complaint on file.     HPI: Patient is a pleasant 40 year old woman who I saw a few weeks ago with coccyx pain.  She says she has been 90% better than previously.  She did stoop over yesterday and had pain from her coccyx out into her buttocks.  She also says her her legs of feel stiff sometimes.  She is planning a trip to [place] in the next couple weeks  Assessment & Plan: Visit Diagnoses: No diagnosis found.  Plan: Advised her to stay on the meloxicam also advised her to get on a stretching program or yoga class.  She did ask for pain medication for her flight I told her I could not do this I did advise her that she needed to spend a lot of time getting up during her flight if she could  Follow-Up Instructions: No follow-ups on file.   Ortho Exam  Patient is alert, oriented, no adenopathy, well-dressed, normal affect, normal respiratory effort. Examination demonstrates mild tenderness over the coccyx no neurological deficits distal strength is intact 5/5 compartments are soft and nontender no tenderness in her lower legs  Imaging: No results found. No images are attached to the encounter.  Labs: Lab Results  Component Value Date   HGBA1C 5.6 09/21/2016     No results found for: ALBUMIN, PREALBUMIN, CBC  No results found for: MG No results found for: VD25OH  No results found for: PREALBUMIN CBC EXTENDED Latest Ref Rng & Units 09/21/2016 11/10/2011  WBC 4.0 - 10.5 K/uL 13.0(H) 16.7(H)  RBC 3.87 - 5.11 MIL/uL 4.63 4.91  HGB 12.0 - 15.0 g/dL 98.9 21.1  HCT 94.1 - 74.0 % 39.0 42.5  PLT 150 - 400 K/uL 361 392  NEUTROABS 1.7 - 7.7 K/uL 7.7 11.4(H)  LYMPHSABS 0.7 - 4.0 K/uL 4.0 4.2(H)     There is no height or weight  on file to calculate BMI.  Orders:  No orders of the defined types were placed in this encounter.  No orders of the defined types were placed in this encounter.    Procedures: No procedures performed  Clinical Data: No additional findings.  ROS:  All other systems negative, except as noted in the HPI. Review of Systems  Objective: Vital Signs: LMP 11/02/2011   Specialty Comments:  No specialty comments available.  PMFS History: Patient Active Problem List   Diagnosis Date Noted  . Bipolar disorder, curr episode mixed, severe, with psychotic features (HCC) 09/20/2016   Past Medical History:  Diagnosis Date  . Anxiety   . Bipolar disorder (HCC)   . Depression   . Fast heart beat    associated with anxiety  . PCOS (polycystic ovarian syndrome)   . Schizophrenia (HCC)     Family History  Problem Relation Age of Onset  . Alcohol abuse Neg Hx   . Anxiety disorder Neg Hx   . Bipolar disorder Neg Hx   . Drug abuse Neg Hx   . Depression Neg Hx   . Schizophrenia Neg Hx   . Suicidality Neg Hx     Past Surgical History:  Procedure Laterality Date  . CESAREAN SECTION     Social History   Occupational History  . Not on file  Tobacco Use  . Smoking status: Never Smoker  . Smokeless tobacco: Never Used  . Tobacco comment: non smoker  Substance and Sexual Activity  . Alcohol use: No  . Drug use: No  . Sexual activity: Yes    Birth control/protection: None

## 2021-05-02 ENCOUNTER — Other Ambulatory Visit (HOSPITAL_COMMUNITY): Payer: Self-pay | Admitting: Psychiatry

## 2021-05-02 DIAGNOSIS — F319 Bipolar disorder, unspecified: Secondary | ICD-10-CM

## 2022-02-12 ENCOUNTER — Ambulatory Visit (INDEPENDENT_AMBULATORY_CARE_PROVIDER_SITE_OTHER): Payer: Managed Care, Other (non HMO) | Admitting: Family

## 2022-02-12 ENCOUNTER — Encounter: Payer: Self-pay | Admitting: Family

## 2022-02-12 VITALS — BP 150/90 | HR 101 | Temp 98.4°F | Resp 16 | Ht 61.0 in | Wt 165.0 lb

## 2022-02-12 DIAGNOSIS — R030 Elevated blood-pressure reading, without diagnosis of hypertension: Secondary | ICD-10-CM | POA: Diagnosis not present

## 2022-02-12 DIAGNOSIS — F3164 Bipolar disorder, current episode mixed, severe, with psychotic features: Secondary | ICD-10-CM | POA: Diagnosis not present

## 2022-02-12 NOTE — Progress Notes (Signed)
? ?New Patient Office Visit ? ?Subjective:  ?Patient ID: [place], female    DOB: October 08, 1981  Age: 41 y.o. MRN: [id number] ? ?CC:  ?Chief Complaint  ?Patient presents with  ? New Patient (Initial Visit)  ?  Patient reports no previous pcp  ? ? ?HPI ?[place] presents for establishing care. ? ?Anxiety/Depression: Patient complains of anxiety disorder, bipolar disorder, and schizophrenia .   ?She has the following symptoms: nervousness.  ?Onset of symptoms was approximately 6 years ago, She denies current suicidal and homicidal ideation.  ?Possible organic causes contributing are: none.  ?Risk factors: none  ?Previous treatment includes  Lamictal, Geodan, Cogentin  and individual therapy.  She complains of the following side effects from the treatment: none. ? ?Past Medical History:  ?Diagnosis Date  ? Anxiety   ? Bipolar 1 disorder (HCC)   ? Bipolar disorder (HCC)   ? Depression   ? Fast heart beat   ? associated with anxiety  ? PCOS (polycystic ovarian syndrome)   ? Schizophrenia (HCC)   ? ? ?Past Surgical History:  ?Procedure Laterality Date  ? CESAREAN SECTION    ? ? ?Family History  ?Problem Relation Age of Onset  ? Hypertension Mother   ? Alcohol abuse Neg Hx   ? Anxiety disorder Neg Hx   ? Bipolar disorder Neg Hx   ? Drug abuse Neg Hx   ? Depression Neg Hx   ? Schizophrenia Neg Hx   ? Suicidality Neg Hx   ? ? ?Social History  ? ?Socioeconomic History  ? Marital status: Married  ?  Spouse name: Not on file  ? Number of children: 1  ? Years of education: 46  ? Highest education level: Not on file  ?Occupational History  ? Not on file  ?Tobacco Use  ? Smoking status: Never  ? Smokeless tobacco: Never  ? Tobacco comments:  ?  non smoker  ?Vaping Use  ? Vaping Use: Never used  ?Substance and Sexual Activity  ? Alcohol use: No  ? Drug use: No  ? Sexual activity: Yes  ?  Birth control/protection: None  ?Other Topics Concern  ? Not on file  ?Social History Narrative  ? Married and living with husband and 12yo  son.  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 [place] in [place] Pt has a BS in Public relations account executive.  ? ?Social Determinants of Health  ? ?Financial Resource Strain: Not on file  ?Food Insecurity: Not on file  ?Transportation Needs: Not on file  ?Physical Activity: Not on file  ?Stress: Not on file  ?Social Connections: Not on file  ?Intimate Partner Violence: Not on file  ? ? ?Objective:  ? ?Today's Vitals: BP (!) 150/90 (BP Location: Left Arm, Patient Position: Sitting, Cuff Size: Small)   Pulse (!) 101   Temp 98.4 ?F (36.9 ?C) (Temporal)   Resp 16   Ht 5\' 1"  (1.549 m)   Wt 165 lb (74.8 kg)   LMP 11/02/2011   SpO2 99%   BMI 31.18 kg/m?  ? ?Physical Exam ?Vitals and nursing note reviewed.  ?Constitutional:   ?   Appearance: Normal appearance.  ?Cardiovascular:  ?   Rate and Rhythm: Normal rate and regular rhythm.  ?Pulmonary:  ?   Effort: Pulmonary effort is normal.  ?   Breath sounds: Normal breath sounds.  ?Musculoskeletal:     ?   General: Normal range of motion.  ?Skin: ?   General:  Skin is warm and dry.  ?Neurological:  ?   Mental Status: She is alert.  ?Psychiatric:     ?   Mood and Affect: Mood normal.     ?   Behavior: Behavior normal.  ? ? ?Assessment & Plan:  ? ?Problem List Items Addressed This Visit   ? ?  ? Other  ? Bipolar disorder, curr episode mixed, severe, with psychotic features (HCC)  ?  pt followed by [facility], stable on cogentin, lamictal, geodan. ?  ?  ? Elevated blood pressure reading - Primary  ?  New, pt is nervous, advised BP higher than should be for being nervous. Rechecked BP at end of visit, 148/92. Advised pt on low sodium diet, exercise, and drinking at least 64oz water. Encouraged pt to schedule a physical with fasting labs and we can recheck BP at that time. ?  ?  ? ? ?Outpatient Encounter Medications as of 02/12/2022  ?Medication Sig  ? benztropine (COGENTIN) 1 MG tablet Take 1 tablet (1 mg total) by mouth at bedtime.  ? lamoTRIgine (LAMICTAL) 200 MG tablet Take  200 mg by mouth at bedtime.  ? lamoTRIgine (LAMICTAL) 25 MG tablet Take 2 tablets (50 mg total) by mouth 2 (two) times daily at 8 am and 10 pm.  ? ziprasidone (GEODON) 60 MG capsule Take 1 capsule (60 mg total) by mouth 2 (two) times daily with a meal.  ? [DISCONTINUED] meloxicam (MOBIC) 15 MG tablet Take 1 tablet (15 mg total) by mouth daily.  ? [DISCONTINUED] mirtazapine (REMERON) 7.5 MG tablet Take 1 tablet (7.5 mg total) by mouth at bedtime.  ? ?No facility-administered encounter medications on file as of 02/12/2022.  ? ? ?Follow-up: Return for Complete physical w/fasting labs.  ? ?Dulce Sellar, NP ?

## 2022-02-12 NOTE — Patient Instructions (Addendum)
Welcome to [facility]! It was a pleasure meeting you today! ? ?As discussed, please schedule a physical with fasting labs at your convenience. ?Your blood pressure is a little high today, remember to drink at least 2 liters of water daily and eat a low sodium diet. Refer to the handout attached. ? ? ? ? ?PLEASE NOTE: ? ?If you had any LAB tests please let us know if you have not heard back within a few days. You may see your results on MyChart before we have a chance to review them but we will give you a call once they are reviewed by [place]. If we ordered any REFERRALS today, please let us know if you have not heard from their office within the next week.  ?Let us know through MyChart if you are needing REFILLS, or have your pharmacy send [place] the request. You can also use MyChart to communicate with me or any office staff. ? ?Please try these tips to maintain a healthy lifestyle: ? ?Eat most of your calories during the day when you are active. Eliminate processed foods including packaged sweets (pies, cakes, cookies), reduce intake of potatoes, white bread, white pasta, and white rice. Look for whole grain options, oat flour or almond flour. ? ?Each meal should contain half fruits/vegetables, one quarter protein, and one quarter carbs (no bigger than a computer mouse). ? ?Cut down on sweet beverages. This includes juice, soda, and sweet tea. Also watch fruit intake, though this is a healthier sweet option, it still contains natural sugar! Limit to 3 servings daily. ? ?Drink at least 1 glass of water with each meal and aim for at least 8 glasses per day ? ?Exercise at least 150 minutes every week.  ? ?

## 2022-02-14 DIAGNOSIS — R030 Elevated blood-pressure reading, without diagnosis of hypertension: Secondary | ICD-10-CM | POA: Insufficient documentation

## 2022-02-14 NOTE — Assessment & Plan Note (Signed)
New, pt is nervous, advised BP higher than should be for being nervous. Rechecked BP at end of visit, 148/92. Advised pt on low sodium diet, exercise, and drinking at least 64oz water. Encouraged pt to schedule a physical with fasting labs and we can recheck BP at that time. ?

## 2022-02-14 NOTE — Assessment & Plan Note (Signed)
pt followed by [facility], stable on cogentin, lamictal, geodan. ?

## 2022-02-19 ENCOUNTER — Encounter: Payer: Managed Care, Other (non HMO) | Admitting: Family

## 2022-11-18 ENCOUNTER — Encounter: Payer: Self-pay | Admitting: *Deleted

## 2023-06-30 ENCOUNTER — Encounter: Payer: Self-pay | Admitting: Family

## 2023-06-30 ENCOUNTER — Telehealth: Payer: Self-pay | Admitting: Family

## 2023-06-30 NOTE — Telephone Encounter (Signed)
Lvm to request cb to schedule cpe
